# Patient Record
Sex: Female | Born: 1950
Health system: Southern US, Community
[De-identification: ages and names within clinical notes are randomized; demographics above are authoritative.]

## PROBLEM LIST (undated history)

## (undated) DIAGNOSIS — I10 Essential (primary) hypertension: Secondary | ICD-10-CM

## (undated) DIAGNOSIS — T7840XA Allergy, unspecified, initial encounter: Secondary | ICD-10-CM

## (undated) DIAGNOSIS — E785 Hyperlipidemia, unspecified: Secondary | ICD-10-CM

## (undated) HISTORY — PX: OVARIAN CYST SURGERY: SHX726

## (undated) HISTORY — DX: Essential (primary) hypertension: I10

## (undated) HISTORY — PX: TONSILLECTOMY: SUR1361

## (undated) HISTORY — PX: APPENDECTOMY: SHX54

## (undated) HISTORY — DX: Hyperlipidemia, unspecified: E78.5

## (undated) HISTORY — DX: Allergy, unspecified, initial encounter: T78.40XA

## (undated) HISTORY — PX: ABDOMINAL HYSTERECTOMY: SHX81

## (undated) HISTORY — PX: WISDOM TOOTH EXTRACTION: SHX21

## (undated) HISTORY — PX: TOTAL ABDOMINAL HYSTERECTOMY W/ BILATERAL SALPINGOOPHORECTOMY: SHX83

---

## 1999-03-24 ENCOUNTER — Encounter: Payer: Self-pay | Admitting: Internal Medicine

## 2004-03-07 ENCOUNTER — Other Ambulatory Visit: Admission: RE | Admit: 2004-03-07 | Discharge: 2004-03-07 | Payer: Self-pay | Admitting: Obstetrics and Gynecology

## 2004-03-18 ENCOUNTER — Encounter (INDEPENDENT_AMBULATORY_CARE_PROVIDER_SITE_OTHER): Payer: Self-pay | Admitting: *Deleted

## 2004-03-18 ENCOUNTER — Inpatient Hospital Stay (HOSPITAL_COMMUNITY): Admission: RE | Admit: 2004-03-18 | Discharge: 2004-03-20 | Payer: Self-pay | Admitting: Obstetrics and Gynecology

## 2004-07-09 ENCOUNTER — Ambulatory Visit: Payer: Self-pay | Admitting: Internal Medicine

## 2004-07-23 ENCOUNTER — Ambulatory Visit: Payer: Self-pay | Admitting: Gastroenterology

## 2004-07-28 ENCOUNTER — Ambulatory Visit (HOSPITAL_COMMUNITY): Admission: RE | Admit: 2004-07-28 | Discharge: 2004-07-28 | Payer: Self-pay | Admitting: Internal Medicine

## 2004-08-08 ENCOUNTER — Encounter: Payer: Self-pay | Admitting: Internal Medicine

## 2004-08-08 ENCOUNTER — Ambulatory Visit: Payer: Self-pay | Admitting: Gastroenterology

## 2004-08-28 ENCOUNTER — Ambulatory Visit: Payer: Self-pay | Admitting: Internal Medicine

## 2004-10-01 ENCOUNTER — Ambulatory Visit: Payer: Self-pay | Admitting: Internal Medicine

## 2004-10-08 ENCOUNTER — Ambulatory Visit: Payer: Self-pay | Admitting: Internal Medicine

## 2005-04-24 ENCOUNTER — Ambulatory Visit: Payer: Self-pay | Admitting: Internal Medicine

## 2005-09-08 ENCOUNTER — Ambulatory Visit (HOSPITAL_COMMUNITY): Admission: RE | Admit: 2005-09-08 | Discharge: 2005-09-08 | Payer: Self-pay | Admitting: Internal Medicine

## 2005-11-18 ENCOUNTER — Ambulatory Visit: Payer: Self-pay | Admitting: Internal Medicine

## 2005-11-25 ENCOUNTER — Ambulatory Visit: Payer: Self-pay | Admitting: Internal Medicine

## 2006-02-16 ENCOUNTER — Ambulatory Visit: Payer: Self-pay | Admitting: Internal Medicine

## 2006-02-16 ENCOUNTER — Encounter: Admission: RE | Admit: 2006-02-16 | Discharge: 2006-02-16 | Payer: Self-pay | Admitting: Internal Medicine

## 2006-09-29 ENCOUNTER — Ambulatory Visit: Payer: Self-pay | Admitting: Internal Medicine

## 2006-10-01 ENCOUNTER — Ambulatory Visit (HOSPITAL_COMMUNITY): Admission: RE | Admit: 2006-10-01 | Discharge: 2006-10-01 | Payer: Self-pay | Admitting: Internal Medicine

## 2006-12-31 ENCOUNTER — Encounter: Payer: Self-pay | Admitting: Internal Medicine

## 2007-01-19 ENCOUNTER — Ambulatory Visit: Payer: Self-pay | Admitting: Internal Medicine

## 2007-01-19 LAB — CONVERTED CEMR LAB
ALT: 19 units/L (ref 0–40)
AST: 21 units/L (ref 0–37)
Albumin: 4 g/dL (ref 3.5–5.2)
Alkaline Phosphatase: 61 units/L (ref 39–117)
BUN: 20 mg/dL (ref 6–23)
Basophils Absolute: 0 10*3/uL (ref 0.0–0.1)
Basophils Relative: 0.4 % (ref 0.0–1.0)
Bilirubin, Direct: 0.1 mg/dL (ref 0.0–0.3)
CO2: 31 meq/L (ref 19–32)
Calcium: 9.3 mg/dL (ref 8.4–10.5)
Chloride: 109 meq/L (ref 96–112)
Cholesterol: 244 mg/dL (ref 0–200)
Creatinine, Ser: 0.7 mg/dL (ref 0.4–1.2)
Direct LDL: 149 mg/dL
Eosinophils Absolute: 0.1 10*3/uL (ref 0.0–0.6)
Eosinophils Relative: 1.8 % (ref 0.0–5.0)
GFR calc Af Amer: 111 mL/min
GFR calc non Af Amer: 92 mL/min
Glucose, Bld: 96 mg/dL (ref 70–99)
HCT: 42.6 % (ref 36.0–46.0)
HDL: 67.5 mg/dL (ref >39.0)
Hemoglobin: 14.2 g/dL (ref 12.0–15.0)
Lymphocytes Relative: 28.7 % (ref 12.0–46.0)
MCHC: 33.4 g/dL (ref 30.0–36.0)
MCV: 88.3 fL (ref 78.0–100.0)
Monocytes Absolute: 0.4 10*3/uL (ref 0.2–0.7)
Monocytes Relative: 7.4 % (ref 3.0–11.0)
Neutro Abs: 3.3 10*3/uL (ref 1.4–7.7)
Neutrophils Relative %: 61.7 % (ref 43.0–77.0)
Platelets: 316 10*3/uL (ref 150–400)
Potassium: 4.3 meq/L (ref 3.5–5.1)
RBC: 4.82 M/uL (ref 3.87–5.11)
RDW: 12.7 % (ref 11.5–14.6)
Sodium: 143 meq/L (ref 135–145)
TSH: 2.13 microintl units/mL (ref 0.35–5.50)
Total Bilirubin: 0.7 mg/dL (ref 0.3–1.2)
Total CHOL/HDL Ratio: 3.6
Total Protein: 6.9 g/dL (ref 6.0–8.3)
Triglycerides: 61 mg/dL (ref 0–149)
VLDL: 12 mg/dL (ref 0–40)
WBC: 5.3 10*3/uL (ref 4.5–10.5)

## 2007-01-26 ENCOUNTER — Ambulatory Visit: Payer: Self-pay | Admitting: Internal Medicine

## 2007-09-08 LAB — CONVERTED CEMR LAB: Pap Smear: NORMAL

## 2007-11-07 ENCOUNTER — Ambulatory Visit (HOSPITAL_COMMUNITY): Admission: RE | Admit: 2007-11-07 | Discharge: 2007-11-07 | Payer: Self-pay | Admitting: *Deleted

## 2008-07-05 ENCOUNTER — Ambulatory Visit: Payer: Self-pay | Admitting: Internal Medicine

## 2008-07-05 LAB — CONVERTED CEMR LAB
ALT: 22 units/L (ref 0–35)
Albumin: 4.2 g/dL (ref 3.5–5.2)
Alkaline Phosphatase: 60 units/L (ref 39–117)
BUN: 19 mg/dL (ref 6–23)
Basophils Absolute: 0 10*3/uL (ref 0.0–0.1)
Basophils Relative: 0.7 % (ref 0.0–3.0)
Bilirubin Urine: NEGATIVE
Bilirubin, Direct: 0.1 mg/dL (ref 0.0–0.3)
CO2: 28 meq/L (ref 19–32)
Calcium: 9.7 mg/dL (ref 8.4–10.5)
Chloride: 103 meq/L (ref 96–112)
Cholesterol: 275 mg/dL (ref 0–200)
Creatinine, Ser: 0.7 mg/dL (ref 0.4–1.2)
Crystals: NEGATIVE
Direct LDL: 164.2 mg/dL
Eosinophils Absolute: 0.1 10*3/uL (ref 0.0–0.7)
Eosinophils Relative: 1.5 % (ref 0.0–5.0)
GFR calc non Af Amer: 91 mL/min
HCT: 39.6 % (ref 36.0–46.0)
HDL: 73.7 mg/dL (ref >39.0)
Hemoglobin, Urine: NEGATIVE
Hemoglobin: 13.8 g/dL (ref 12.0–15.0)
Ketones, ur: NEGATIVE mg/dL
Lymphocytes Relative: 31.2 % (ref 12.0–46.0)
MCHC: 34.9 g/dL (ref 30.0–36.0)
MCV: 87.4 fL (ref 78.0–100.0)
Mucus, UA: NEGATIVE
Neutro Abs: 3 10*3/uL (ref 1.4–7.7)
Neutrophils Relative %: 60.9 % (ref 43.0–77.0)
Nitrite: NEGATIVE
Potassium: 4.1 meq/L (ref 3.5–5.1)
RBC: 4.54 M/uL (ref 3.87–5.11)
RDW: 12.5 % (ref 11.5–14.6)
Sodium: 139 meq/L (ref 135–145)
Specific Gravity, Urine: 1.01 (ref 1.000–1.03)
TSH: 1.63 microintl units/mL (ref 0.35–5.50)
Total Bilirubin: 0.9 mg/dL (ref 0.3–1.2)
Total CHOL/HDL Ratio: 3.7
Total Protein, Urine: NEGATIVE mg/dL
Triglycerides: 46 mg/dL (ref 0–149)
Urine Glucose: NEGATIVE mg/dL
Urobilinogen, UA: 0.2 (ref 0.0–1.0)
VLDL: 9 mg/dL (ref 0–40)
WBC: 5 10*3/uL (ref 4.5–10.5)
pH: 5.5 (ref 5.0–8.0)

## 2008-07-12 ENCOUNTER — Ambulatory Visit: Payer: Self-pay | Admitting: Internal Medicine

## 2008-07-12 DIAGNOSIS — E785 Hyperlipidemia, unspecified: Secondary | ICD-10-CM | POA: Insufficient documentation

## 2008-10-17 ENCOUNTER — Ambulatory Visit: Payer: Self-pay | Admitting: Internal Medicine

## 2008-11-07 ENCOUNTER — Ambulatory Visit (HOSPITAL_COMMUNITY): Admission: RE | Admit: 2008-11-07 | Discharge: 2008-11-07 | Payer: Self-pay | Admitting: Internal Medicine

## 2008-11-16 ENCOUNTER — Telehealth: Payer: Self-pay | Admitting: Internal Medicine

## 2009-03-29 ENCOUNTER — Ambulatory Visit: Payer: Self-pay | Admitting: Family Medicine

## 2009-07-22 ENCOUNTER — Encounter (INDEPENDENT_AMBULATORY_CARE_PROVIDER_SITE_OTHER): Payer: Self-pay | Admitting: *Deleted

## 2009-08-19 ENCOUNTER — Ambulatory Visit: Payer: Self-pay | Admitting: Internal Medicine

## 2009-10-15 ENCOUNTER — Encounter (INDEPENDENT_AMBULATORY_CARE_PROVIDER_SITE_OTHER): Payer: Self-pay

## 2009-10-16 ENCOUNTER — Ambulatory Visit: Payer: Self-pay | Admitting: Gastroenterology

## 2009-10-30 ENCOUNTER — Ambulatory Visit: Payer: Self-pay | Admitting: Gastroenterology

## 2009-11-25 ENCOUNTER — Ambulatory Visit (HOSPITAL_COMMUNITY): Admission: RE | Admit: 2009-11-25 | Discharge: 2009-11-25 | Payer: Self-pay | Admitting: Internal Medicine

## 2009-11-25 LAB — HM MAMMOGRAPHY

## 2010-01-05 LAB — CONVERTED CEMR LAB: Pap Smear: NORMAL

## 2010-01-28 ENCOUNTER — Ambulatory Visit: Payer: Self-pay | Admitting: Internal Medicine

## 2010-01-28 LAB — CONVERTED CEMR LAB
ALT: 27 units/L (ref 0–35)
AST: 29 units/L (ref 0–37)
Alkaline Phosphatase: 55 units/L (ref 39–117)
BUN: 21 mg/dL (ref 6–23)
Basophils Relative: 1 % (ref 0.0–3.0)
Bilirubin, Direct: 0.1 mg/dL (ref 0.0–0.3)
Chloride: 110 meq/L (ref 96–112)
Direct LDL: 154.4 mg/dL
Eosinophils Absolute: 0.1 10*3/uL (ref 0.0–0.7)
Eosinophils Relative: 2.4 % (ref 0.0–5.0)
GFR calc non Af Amer: 112.46 mL/min (ref >60)
Glucose, Bld: 88 mg/dL (ref 70–99)
HCT: 37.7 % (ref 36.0–46.0)
Hemoglobin: 13.2 g/dL (ref 12.0–15.0)
Ketones, ur: NEGATIVE mg/dL
Leukocytes, UA: NEGATIVE
Lymphocytes Relative: 31.5 % (ref 12.0–46.0)
Lymphs Abs: 1.6 10*3/uL (ref 0.7–4.0)
MCHC: 35 g/dL (ref 30.0–36.0)
MCV: 88.9 fL (ref 78.0–100.0)
Monocytes Relative: 7.1 % (ref 3.0–12.0)
Neutro Abs: 2.9 10*3/uL (ref 1.4–7.7)
Platelets: 239 10*3/uL (ref 150.0–400.0)
Potassium: 4.5 meq/L (ref 3.5–5.1)
RBC: 4.24 M/uL (ref 3.87–5.11)
Sodium: 143 meq/L (ref 135–145)
Specific Gravity, Urine: 1.01 (ref 1.000–1.030)
TSH: 2.63 microintl units/mL (ref 0.35–5.50)
Total Bilirubin: 0.8 mg/dL (ref 0.3–1.2)
Total Protein, Urine: NEGATIVE mg/dL
Total Protein: 6.3 g/dL (ref 6.0–8.3)
Urine Glucose: NEGATIVE mg/dL
Urobilinogen, UA: 0.2 (ref 0.0–1.0)
VLDL: 8 mg/dL (ref 0.0–40.0)
WBC: 5.1 10*3/uL (ref 4.5–10.5)
pH: 5.5 (ref 5.0–8.0)

## 2010-02-04 ENCOUNTER — Ambulatory Visit: Payer: Self-pay | Admitting: Internal Medicine

## 2010-09-07 LAB — HM PAP SMEAR: HM Pap smear: NORMAL

## 2010-10-07 NOTE — Letter (Signed)
Summary: [facility] Instructions  [name]  [address] [place], [place] [address]   Phone: [phone]  Fax: [phone]       Toni Mcgrath    07-31-1951    MRN: [id number]        Procedure Day Dorna Bloom:  Wednesday 10/30/09     Arrival Time:  9:00am     Procedure Time:  10:00am     Location of Procedure:                    _ X_  [facility] (4th Floor)                        PREPARATION FOR COLONOSCOPY WITH MOVIPREP   Starting 5 days prior to your procedure   Friday 02/23  do not eat nuts, seeds, popcorn, corn, beans, peas,  salads, or any raw vegetables.  Do not take any fiber supplements (e.g. Metamucil, Citrucel, and Benefiber).  THE DAY BEFORE YOUR PROCEDURE         DATE:  02/22   DAY:  Tuesday  1.  Drink clear liquids the entire day-NO SOLID FOOD  2.  Do not drink anything colored red or purple.  Avoid juices with pulp.  No orange juice.  3.  Drink at least 64 oz. (8 glasses) of fluid/clear liquids during the day to prevent dehydration and help the prep work efficiently.  CLEAR LIQUIDS INCLUDE: Water Jello Ice Popsicles Tea (sugar ok, no milk/cream) Powdered fruit flavored drinks Coffee (sugar ok, no milk/cream) Gatorade Juice: apple, white grape, white cranberry  Lemonade Clear bullion, consomm, broth Carbonated beverages (any kind) Strained chicken noodle soup Hard Candy                             4.  In the morning, mix first dose of MoviPrep solution:    Empty 1 Pouch A and 1 Pouch B into the disposable container    Add lukewarm drinking water to the top line of the container. Mix to dissolve    Refrigerate (mixed solution should be used within 24 hrs)  5.  Begin drinking the prep at 5:00 p.m. The MoviPrep container is divided by 4 marks.   Every 15 minutes drink the solution down to the next mark (approximately 8 oz) until the full liter is complete.   6.  Follow completed prep with 16 oz of clear liquid of your  choice (Nothing red or purple).  Continue to drink clear liquids until bedtime.  7.  Before going to bed, mix second dose of MoviPrep solution:    Empty 1 Pouch A and 1 Pouch B into the disposable container    Add lukewarm drinking water to the top line of the container. Mix to dissolve    Refrigerate  THE DAY OF YOUR PROCEDURE      DATE:  02/23  DAY:  Wednesday  Beginning at  5:00 a.m. (5 hours before procedure):         1. Every 15 minutes, drink the solution down to the next mark (approx 8 oz) until the full liter is complete.  2. Follow completed prep with 16 oz. of clear liquid of your choice.    3. You may drink clear liquids until  8:00am  (2 HOURS BEFORE PROCEDURE).   MEDICATION INSTRUCTIONS  Unless otherwise instructed, you should take regular prescription medications with a  small sip of water   as early as possible the morning of your procedure.          OTHER INSTRUCTIONS  You will need a responsible adult at least 62 years of age to accompany you and drive you home.   This person must remain in the waiting room during your procedure.  Wear loose fitting clothing that is easily removed.  Leave jewelry and other valuables at home.  However, you may wish to bring a book to read or  an iPod/MP3 player to listen to music as you wait for your procedure to start.  Remove all body piercing jewelry and leave at home.  Total time from sign-in until discharge is approximately 2-3 hours.  You should go home directly after your procedure and rest.  You can resume normal activities the  day after your procedure.  The day of your procedure you should not:   Drive   Make legal decisions   Operate machinery   Drink alcohol   Return to work  You will receive specific instructions about eating, activities and medications before you leave.    The above instructions have been reviewed and explained to me by  Ulis Rias RN  October 16, 2009 8:12 AM     I  fully understand and can verbalize these instructions _____________________________ Date _________

## 2010-10-07 NOTE — Miscellaneous (Signed)
Summary: Lec previsit  Clinical Lists Changes  Medications: Added new medication of MOVIPREP 100 GM  SOLR (PEG-KCL-NACL-NASULF-NA ASC-C) As per prep instructions. - Signed Rx of MOVIPREP 100 GM  SOLR (PEG-KCL-NACL-NASULF-NA ASC-C) As per prep instructions.;  #1 x 0;  Signed;  Entered by: Ulis Rias RN;  Authorized by: Louis Meckel MD;  Method used: Electronically to [facility] [phone]*, [address] [place], [place], [place]  [address], Ph: [phone] or [phone], Fax: [phone] Observations: Added new observation of ALLERGY REV: Done (10/16/2009 7:54)    Prescriptions: MOVIPREP 100 GM  SOLR (PEG-KCL-NACL-NASULF-NA ASC-C) As per prep instructions.  #1 x 0   Entered by:   Ulis Rias RN   Authorized by:   Louis Meckel MD   Signed by:   Ulis Rias RN on 10/16/2009   Method used:   Electronically to        [facility] [phone]* (retail)       [address] [place], [place]  [address]       Ph: [phone] or [phone]       Fax: [phone]   RxID:   [id number]

## 2010-10-07 NOTE — Assessment & Plan Note (Signed)
Summary: CPX // RS   Vital Signs:  Patient profile:   60 year old female Height:      63.5 inches Weight:      152 pounds BMI:     26.60 Temp:     98 degrees F oral Pulse rate:   72 / minute Resp:     12 per minute BP sitting:   142 / 78  Vitals Entered By: Lynann Beaver CMA (Feb 04, 2010 10:06 AM)  Serial Vital Signs/Assessments:  Time      Position  BP       Pulse  Resp  Temp     By                     130/84                         Birdie Sons MD  CC: cpx Is Patient Diabetic? No Pain Assessment Patient in pain? no        CC:  cpx.  History of Present Illness: cpx   Current Medications (verified): 1)  Triamcinolone Acetonide 0.5 % Crea (Triamcinolone Acetonide) .... Apply 1 A Small Amount To Affected Area Twice A Day 2)  Multivitamins   Caps (Multiple Vitamin) .... Once Daily 3)  Caltrate 600+d 600-400 Mg-Unit  Tabs (Calcium Carbonate-Vitamin D) .... Calcium 1500mg  and Vit D 1000 International Units Once Daily 4)  Aspirin 81 Mg  Tbec (Aspirin) .... Once Daily 5)  Fish Oil 500 Mg  Caps (Omega-3 Fatty Acids) .... 600 Mg Two Times A Day? 6)  Flax Seed Oil 1000 Mg  Caps (Flaxseed (Linseed)) .... Two Times A Day 7)  Vitamin C Cr 500 Mg Cr-Caps (Ascorbic Acid) .... Once Daily 8)  Vitamin D 400 Unit Tabs (Cholecalciferol) .... 750 Mg Daily 9)  Super B Complex  Tabs (B Complex-C) .... One By Mouth Daily  Allergies (verified): No Known Drug Allergies  Past History:  Past Medical History: Last updated: 04/20/2007 Hyperlipidemia Hypertension  Past Surgical History: Last updated: 2008/08/09 ovarian cyst  1973--incidental appendectomy Hysterectomy, TAH, BSO  2005--  Family History: Last updated: 08/09/2008 mother deceased--stroke 52 father - heart disease deceased 59  Social History: Last updated: 08/09/2008 Occupation: attorney Married Never Smoked Alcohol use-yes Regular exercise-no  Risk Factors: Exercise: no (2008-08-09)  Risk Factors: Smoking  Status: never (08/09/2008)  Physical Exam  General:  Well-developed,well-nourished,in no acute distress; alert,appropriate and cooperative throughout examination Head:  normocephalic and atraumatic.   Eyes:  pupils equal and pupils round.   Ears:  R ear normal and L ear normal.   Neck:  No deformities, masses, or tenderness noted. Chest Wall:  No deformities, masses, or tenderness noted. Lungs:  Normal respiratory effort, chest expands symmetrically. Lungs are clear to auscultation, no crackles or wheezes. Heart:  normal rate, regular rhythm, and no gallop.   Abdomen:  Bowel sounds positive,abdomen soft and non-tender without masses, organomegaly or hernias noted. Msk:  No deformity or scoliosis noted of thoracic or lumbar spine.   Extremities:  No clubbing, cyanosis, edema, or deformity noted  Neurologic:  cranial nerves II-XII intact and gait normal.   Skin:  turgor normal and color normal.   patch of scaley skin behind left ear----3 cm in diameter   Impression & Recommendations:  Problem # 1:  PREVENTIVE HEALTH CARE (ICD-V70.0) health maint UTD reviewed records of colonoscopy/mammo/pap smear she will maintain activelfestyle and excellent health habits.   Problem #  2:  HYPERLIPIDEMIA (ICD-272.4) no treatment note HDL  Complete Medication List: 1)  Triamcinolone Acetonide 0.5 % Crea (Triamcinolone acetonide) .... Apply 1 a small amount to affected area twice a day 2)  Multivitamins Caps (Multiple vitamin) .... Once daily 3)  Caltrate 600+d 600-400 Mg-unit Tabs (Calcium carbonate-vitamin d) .... Calcium 1500mg  and vit d 1000 international units once daily 4)  Aspirin 81 Mg Tbec (Aspirin) .... Once daily 5)  Fish Oil 500 Mg Caps (Omega-3 fatty acids) .... 600 mg two times a day? 6)  Flax Seed Oil 1000 Mg Caps (Flaxseed (linseed)) .... Two times a day 7)  Vitamin C Cr 500 Mg Cr-caps (Ascorbic acid) .... Once daily 8)  Vitamin D 400 Unit Tabs (Cholecalciferol) .... 750 mg  daily 9)  Super B Complex Tabs (B complex-c) .... One by mouth daily Prescriptions: TRIAMCINOLONE ACETONIDE 0.5 % CREA (TRIAMCINOLONE ACETONIDE) Apply 1 a small amount to affected area twice a day  #30 grams x 1   Entered and Authorized by:   Birdie Sons MD   Signed by:   Birdie Sons MD on 02/04/2010   Method used:   Electronically to        [facility] [phone]* (retail)       [address] [place], [place]  [address]       Ph: [phone] or [phone]       Fax: [phone]   RxID:   [id number]      Preventive Care Screening  Mammogram:    Date:  11/05/2009    Next Due:  12/2011    Results:  normal   Pap Smear:    Date:  01/05/2010    Next Due:  01/2013    Results:  normal

## 2010-10-07 NOTE — Procedures (Signed)
Summary: Colonoscopy  Patient: [name] Note: All result statuses are Final unless otherwise noted.  Tests: (1) Colonoscopy (COL)   COL Colonoscopy           DONE     [facility]     [address]     [place], [place]  [address]           COLONOSCOPY PROCEDURE REPORT           PATIENT:  Toni Mcgrath, Toni Mcgrath  MR#:  [id number]     BIRTHDATE:  08/30/1951, 58 yrs. old  GENDER:  female           ENDOSCOPIST:  Barbette Hair. Arlyce Dice, MD     Referred by:           PROCEDURE DATE:  10/30/2009     PROCEDURE:  Colonoscopy, Diagnostic     ASA CLASS:  Class I     INDICATIONS:  history of pre-cancerous (adenomatous) colon polyps                 MEDICATIONS:   Fentanyl 50 mcg IV, Versed 7 mg IV           DESCRIPTION OF PROCEDURE:   After the risks benefits and     alternatives of the procedure were thoroughly explained, informed     consent was obtained.  Digital rectal exam was performed and     revealed no abnormalities.   The LB [id number]-[id number] endoscope     was introduced through the anus and advanced to the cecum, which     was identified by both the appendix and ileocecal valve, without     limitations.  The quality of the prep was excellent, using     MoviPrep.  The instrument was then slowly withdrawn as the colon     was fully examined.     <<PROCEDUREIMAGES>>           FINDINGS:  A normal appearing cecum, ileocecal valve, and     appendiceal orifice were identified. The ascending, hepatic     flexure, transverse, splenic flexure, descending, sigmoid colon,     and rectum appeared unremarkable (see image1, image2, image3,     image5, image7, image10, image12, image14, and image16).     Retroflexed views in the rectum revealed no abnormalities.    The     scope was then withdrawn from the patient and the procedure     completed.           COMPLICATIONS:  None           ENDOSCOPIC IMPRESSION:     1) Normal colon     RECOMMENDATIONS:     1) colonoscopy in 10 years        REPEAT EXAM:  In 10 year(s) for Colonoscopy.           ______________________________     Barbette Hair. Arlyce Dice, MD           CC:  Lindley Magnus, MD           n.     Rosalie DoctorBarbette Hair. Aalina Brege at 10/30/2009 11:12 AM           [place], Synetta Fail, [phone]  Note: An exclamation mark (!) indicates a result that was not dispersed into the flowsheet. Document Creation Date: 10/30/2009 11:12 AM _______________________________________________________________________  (1) Order result status: Final Collection or observation date-time: 10/30/2009 11:02 Requested date-time:  Receipt date-time:  Reported  date-time:  Referring Physician:   Ordering Physician: Melvia Heaps [phone]) Specimen Source:  Source: Launa Grill Order Number: [id number] Lab site:   Appended Document: Colonoscopy    Clinical Lists Changes  Observations: Added new observation of COLONNXTDUE: 10/2019 (10/30/2009 11:20)      Appended Document: Colonoscopy     Procedures Next Due Date:    Colonoscopy: 10/2019

## 2010-12-10 ENCOUNTER — Other Ambulatory Visit: Payer: Self-pay | Admitting: Internal Medicine

## 2010-12-10 DIAGNOSIS — Z1231 Encounter for screening mammogram for malignant neoplasm of breast: Secondary | ICD-10-CM

## 2010-12-11 ENCOUNTER — Ambulatory Visit (HOSPITAL_COMMUNITY)
Admission: RE | Admit: 2010-12-11 | Discharge: 2010-12-11 | Disposition: A | Discharge status: Home / Self Care | Payer: BC Managed Care – PPO | Source: Ambulatory Visit | Attending: Internal Medicine | Admitting: Internal Medicine

## 2010-12-11 DIAGNOSIS — Z1231 Encounter for screening mammogram for malignant neoplasm of breast: Secondary | ICD-10-CM

## 2010-12-17 ENCOUNTER — Encounter: Payer: Self-pay | Admitting: Internal Medicine

## 2010-12-18 ENCOUNTER — Ambulatory Visit (INDEPENDENT_AMBULATORY_CARE_PROVIDER_SITE_OTHER): Payer: BC Managed Care – PPO | Admitting: Internal Medicine

## 2010-12-18 ENCOUNTER — Encounter: Payer: Self-pay | Admitting: Internal Medicine

## 2010-12-18 VITALS — BP 120/82 | HR 83 | Temp 98.3°F | Ht 64.0 in | Wt 158.0 lb

## 2010-12-18 DIAGNOSIS — J40 Bronchitis, not specified as acute or chronic: Secondary | ICD-10-CM

## 2010-12-18 MED ORDER — HYDROCOD POLST-CHLORPHEN POLST 10-8 MG/5ML PO LQCR: 5.0000 mL | Freq: Two times a day (BID) | ORAL | Status: DC | PRN

## 2010-12-18 MED ORDER — DOXYCYCLINE HYCLATE 100 MG PO TABS: 100.0000 mg | ORAL_TABLET | Freq: Two times a day (BID) | ORAL | Status: AC

## 2010-12-18 NOTE — Progress Notes (Signed)
  Subjective:    Patient ID: Toni Mcgrath, female    DOB: 1950-12-16, 60 y.o.   MRN: [id number]  HPI patient presents to clinic for evaluation of cough. Notes two week history of cough sore throat and nasal congestion without fever chills shortness of breath or wheezing. Cough is nonproductive. No known sick exposure.Taking over-the-counter medication without improvement. No other exacerbating or alleviating factors. No other complaints.  Review of past medical history, medications and allergies.  Review of Systems  Constitutional: Negative for fever, chills and fatigue.  HENT: Positive for congestion and rhinorrhea. Negative for facial swelling and ear discharge.   Eyes: Negative for discharge and redness.  Respiratory: Positive for cough. Negative for shortness of breath and wheezing.        Objective:   Physical Exam  Vitals reviewed. Constitutional: She appears well-developed and well-nourished. No distress.  HENT:  Head: Normocephalic and atraumatic.  Right Ear: Tympanic membrane, external ear and ear canal normal.  Left Ear: Tympanic membrane, external ear and ear canal normal.  Nose: Nose normal.  Mouth/Throat: Oropharynx is clear and moist. No oropharyngeal exudate.  Eyes: Conjunctivae are normal. No scleral icterus.  Neck: Neck supple.  Cardiovascular: Normal rate, regular rhythm and normal heart sounds.  Exam reveals no gallop and no friction rub.   No murmur heard. Pulmonary/Chest: Effort normal and breath sounds normal. No respiratory distress. She has no wheezes. She has no rales.  Lymphadenopathy:    She has no cervical adenopathy.  Neurological: She is alert.  Skin: Skin is warm and dry. No rash noted. She is not diaphoretic. No erythema.  Psychiatric: She has a normal mood and affect.          Assessment & Plan:

## 2010-12-18 NOTE — Assessment & Plan Note (Signed)
Begin ten-day course of doxycycline. Attempt Tussionex when necessary cough. Cautioned regarding possible sedating effect. Followup if no improvement or worsening

## 2011-01-23 ENCOUNTER — Other Ambulatory Visit: Payer: Self-pay | Admitting: Internal Medicine

## 2011-01-23 ENCOUNTER — Other Ambulatory Visit (INDEPENDENT_AMBULATORY_CARE_PROVIDER_SITE_OTHER): Payer: BC Managed Care – PPO

## 2011-01-23 ENCOUNTER — Other Ambulatory Visit (INDEPENDENT_AMBULATORY_CARE_PROVIDER_SITE_OTHER): Payer: BC Managed Care – PPO | Admitting: Internal Medicine

## 2011-01-23 DIAGNOSIS — Z0000 Encounter for general adult medical examination without abnormal findings: Secondary | ICD-10-CM

## 2011-01-23 DIAGNOSIS — Z13220 Encounter for screening for lipoid disorders: Secondary | ICD-10-CM

## 2011-01-23 LAB — HEPATIC FUNCTION PANEL
ALT: 20 U/L (ref 0–35)
AST: 23 U/L (ref 0–37)
Albumin: 3.7 g/dL (ref 3.5–5.2)
Alkaline Phosphatase: 52 U/L (ref 39–117)
Total Protein: 6.3 g/dL (ref 6.0–8.3)

## 2011-01-23 LAB — URINALYSIS, ROUTINE W REFLEX MICROSCOPIC
Hgb urine dipstick: NEGATIVE
Nitrite: NEGATIVE
Total Protein, Urine: NEGATIVE
Urine Glucose: NEGATIVE
pH: 6 (ref 5.0–8.0)

## 2011-01-23 LAB — CBC WITH DIFFERENTIAL/PLATELET
Basophils Absolute: 0.1 10*3/uL (ref 0.0–0.1)
Eosinophils Absolute: 0.4 10*3/uL (ref 0.0–0.7)
Hemoglobin: 13.9 g/dL (ref 12.0–15.0)
Lymphocytes Relative: 33.6 % (ref 12.0–46.0)
MCHC: 34.8 g/dL (ref 30.0–36.0)
Neutro Abs: 2.5 10*3/uL (ref 1.4–7.7)
Platelets: 265 10*3/uL (ref 150.0–400.0)
RDW: 13.4 % (ref 11.5–14.6)

## 2011-01-23 LAB — LDL CHOLESTEROL, DIRECT: Direct LDL: 165.3 mg/dL

## 2011-01-23 LAB — BASIC METABOLIC PANEL
Calcium: 9.4 mg/dL (ref 8.4–10.5)
Chloride: 105 mEq/L (ref 96–112)
Creatinine, Ser: 0.8 mg/dL (ref 0.4–1.2)
Sodium: 139 mEq/L (ref 135–145)

## 2011-01-23 LAB — LIPID PANEL
Cholesterol: 242 mg/dL — ABNORMAL HIGH (ref 0–200)
Total CHOL/HDL Ratio: 3
Triglycerides: 31 mg/dL (ref 0.0–149.0)

## 2011-01-23 NOTE — H&P (Signed)
NAME:  BRINDLEY, MADARANG                         ACCOUNT NO.:  [id number]   MEDICAL RECORD NO.:  [id number]                   PATIENT TYPE:  INP   LOCATION:  NA                                   [facility]ACILITY:  [facility]   PHYSICIAN:  Carrington Clamp, M.D.              DATE OF BIRTH:  01-Feb-1951   DATE OF ADMISSION:  DATE OF DISCHARGE:                                HISTORY & PHYSICAL   CHIEF COMPLAINT:  This is a 60 year old, G0 referred to me by Valetta Mole.  Swords, M.D. for evaluation of pelvic mass.   HISTORY OF PRESENT ILLNESS:  Ms. Griffy had presented for her routine  annual exam and was found to have an enlarged abdomen.  The patient stated  that she had had increasing waist size but no decrease in appetite, no early  satiety and no pain or any other abnormalities with her bowel movements over  the last year to two years.  The patient had stopped her periods several  years ago, has not been on hormone replacement therapy, has not had any  vaginal bleeding, has not had any postcoital vaginal bleeding. The patient  complained of some vaginal dryness and pain from dryness but no other  abdominal pain or dyspareunia. The patient may have had some hot flashes in  the past but not recently. The patient complains of no weight loss.  The  patient complains of no increase in voiding, loss of urine with cough,  constipation, diarrhea or blood in stools.   The patient had undergone a CT examination which showed a large complex mass  23 x 12 x 30 cm which was majority fluid.  The patient was then referred to  me for evaluation.   PAST MEDICAL HISTORY:  The patient has a history of elevated cholesterol but  no history of angina, chest pain, shortness of breath, diabetes, high blood  pressure or thyroid disease.   PAST SURGICAL HISTORY:  The patient had an ovarian cystectomy, possible  oophorectomy and in 1993 with an appendectomy.  This was approximately a  basketball size cyst probably on the  right hand side and the procedure was  done through an exploratory laparotomy.   PAST GYN HISTORY:  The patient has no history of abnormality Pap smears,  sexually transmitted diseases or pelvic infections.   ALLERGIES:  No known drug allergies.   MEDICATIONS:  None.   TOBACCO:  None.   PHYSICAL EXAMINATION:  VITAL SIGNS:  The patient's blood pressure was  120/80.  GENERAL:  Well appearing and anicteric.  NECK:  Without thyromegaly or lymphadenopathy.  LUNGS:  Clear to auscultation bilaterally.  HEART:  Regular rate and rhythm.  ABDOMEN:  Showed a large immobile mass that made the abdomen look  approximately 28-[redacted] weeks pregnant. This was not tympanic. There was no  rebound, guarding or tenderness at all over the abdomen.  BREASTS:  Symmetric without masses or discharge.  PELVIC:  Revealed normal external genitalia, normal vault and vagina and  cervix. The cervix and uterus felt to be a normal size on rectovaginal exam  and cervix was mobile without thickening of the parametria. The adnexa were  not able to be palpated. The 30 cm mass seemed to be large and fairly  immobile.  Rectovaginal exam revealed negative guaiac.  A Pap smear was  performed at this time.   CT showed a large complex abdominopelvic mass extending from level 1 to the  bladder dome measuring 23 x 12 x 30 cm. The majority of the lesion was fluid  density; however, multiple thin septations were present. There was mild wall  thickening present at the left lateral inferior region. There was no  intraabdominal lymphadenopathy seen, no free fluid. The mass could not be  determined to be definitely from ovarian origin. Lungs and the abdominal  anatomy appeared to be normal.  Ultrasound performed confirmed the size of  the mass and also saw within the mass the complex cyst.  A thick walled  irregularly circumscribed anechoic focus about 6 x 4 x 6.  The endometrium  was irregularly thickened, there was no free fluid  seen. Neither ovary was  able to be identified but the mass appeared to be more to the left.  The  uterus was 8.6 x 4 x 5 and the endometrial thickness was actually 2 cm.   LABORATORY DATA:  CA 125 was performed at 32  with range 0 to 30, CEA was  2.5 range 0-5. HCG was 5.1, range less than 5.  CA 19-9 84, range less than  35.  H&H was 14.3 and 41.3, glucose 92, BUN and creatinine 21 and 0.8.   ASSESSMENT:  This is a 60 year old postmenopausal female with a large 3 cm  abdominopelvic mass suspected to be of ovarian origin. The patient will  undergo an exploratory laparotomy with total abdominal hysterectomy and  possible bilateral salpingo-oophorectomy depending on whether or not she  still has retained her right ovary. The pelvic mass will be removed and sent  to pathology for frozen section.  If the frozen section comes back positive  for cancer, Dr. Stanford Breed is on standby for pelvic lymph node  dissection and a staging procedure will be performed as well. The patient  has received a mild bowel prep of magnesium citrate. She will receive  preoperative antibiotics and __________ during surgery.  All risks,  benefits, and alternatives were discussed with the patient, the patient  understands and wishes to proceed.                                               Carrington Clamp, M.D.    MH/MEDQ  D:  03/18/2004  T:  03/18/2004  Job:  [id number]

## 2011-01-23 NOTE — Op Note (Signed)
NAME:  Toni Mcgrath, Toni Mcgrath                         ACCOUNT NO.:  [id number]   MEDICAL RECORD NO.:  [id number]                   PATIENT TYPE:  INP   LOCATION:  NA                                   FACILITY:  [facility]   PHYSICIAN:  Carrington Clamp, M.D.              DATE OF BIRTH:  [birth year]   DATE OF PROCEDURE:  03/18/2004  DATE OF DISCHARGE:                                 OPERATIVE REPORT   PREOPERATIVE DIAGNOSES:  _______ abdominopelvic mass.   POSTOPERATIVE DIAGNOSES:  Left mucinous cystadenoma of the ovary.   PROCEDURE:  Exploratory laparotomy with total abdominal hysterectomy, left  salpingo-oophorectomy.   ATTENDING PHYSICIAN:  Carrington Clamp, M.D.   ASSISTANT:  Randye Lobo, M.D.   ANESTHESIA:  General endotracheal anesthesia.   ESTIMATED BLOOD LOSS:  200 mL.   IV FLUIDS:  2500 mL.   URINE OUTPUT:  100 mL.   COMPLICATIONS:  None.   FINDINGS:  A 30 x 30 cm ovarian mass removed intact. There was no ovary  evident on the right, there were normal tubes, uterus, cervix, liver edge  seen.  There was no appendix seen as the patient had had a prior  appendectomy.  Palpation of kidneys and diaphragm was normal.   MEDICATIONS:  None.   PATHOLOGY:  Frozen section of the entire left ovarian mass revealed a benign  mucinous cystadenoma. Permanent section was left salpinx and ovarian mass,  uterus and cervix.   COUNTS:  Correct x3.   TECHNIQUE:  After adequate general anesthesia was achieved, the patient was  prepped and draped in the usual sterile fashion in dorsal lithotomy  position. A vertical skin incision was made with the scalpel through the  skin and carried down to the fascia with the Bovie cautery. The fascia was  incised in the midline with the scalpel in a superior and inferior manner  with the Mayo scissors. The fascia was grasped with the Kocher's and the  rectus muscles reflected from the fascia on the right hand side. The midline  was identified and the  bowel free portion of peritoneum tinted up and  entered into with the Metzenbaum scissors. The rectus muscles were split in  the midline and the fascia was incised a superior and inferior manner with  the Metzenbaum scissors with good visualization of the bowel and the  bladder.  Washings were taken at this point and the ovarian mass palpated  and found to be mobile but bigger than expected. The mass was approximately  30 cm across the top of the width and so the abdominal incision was extended  in the same manner as above to the falciform ligament.  The mass was then  carefully slipped out of the abdominal incision and identified as being the  left ovary.  The infundibulopelvic ligament was identified, the ureter was  identified as being close to the infundibulopelvic ligament but below the  field of  discharge. Keeping in mind the ureter, a Heaney was used to clamp  over the infundibulopelvic ligament with the ureter well below the field of  dissection. This was secured with two sutures of #0 Vicryl. The tube and  mesosalpinx were then clamped with the Heaney and again secured with a  stitch of #0 Vicryl at the level of the cornua of the uterus. The uterine  ovarian ligament was then clamped with a Heaney and incised with the Mayo  scissors.   The mass was able to be amputated and handed to waiting pathology completely  intact.   Attention was then turned to the round ligament which was secured with the  suture transfixion stitch of #0 Vicryl. The round ligament was divided with  the Bovie cautery. The infundibulopelvic ligament was once again clamped  with a Heaney. The ureter was again seen and added the field of dissection.  The avascular portion of the mesosalpinx was entered into with the Bovie  cautery and a free hand tie was placed around the infundibulopelvic ligament  followed by a stitch of #0 Vicryl.  Again the ureter was out of the field of  dissection. The peritoneum of the  round ligament was then incised with the  Bovie cautery and then anteriorly through the vesicouterine fascia to start  creating the bladder flap.   The round ligament on the right hand side was secured with a suture  transfixion stitch of #0 Vicryl. The pedicle was divided with the Bovie  cautery and the rest of the bladder flap created with blunt and sharp  dissection of Metzenbaum scissors. The uterine arteries on the right hand  side were skeletonized keeping the ureter outside of the field of  dissection.   At the level of internal os, a pair of Heaney's were used to clamp the  uterine arteries. Each pedicle was incised with the Mayo scissors and  secured with a stitch of #0 Vicryl. The ureter fell away bilaterally and  continued dissection of the cardinal ligament was then performed staying  close to the cervix with alternating successive bites of the straight Heaney  clamp. Each pedicle was divided with a knife and then secured with a stitch  of #0 Vicryl.  At the level of the reflection of the vagina onto the cervix  and also the uterosacral's, a pair of Heaney's were placed bilaterally. Mayo  scissors were used to incise the pedicles, each angle of the cuff was  secured with a Heaney stitch of #0 Vicryl. The cervix was then amputated and  the specimen handed off to pathology.   During the dissection of the cardinal ligament, the uterine cavity had been  entered into sharply and a quantity of old blood spilled out. This was  washed off the field and the uterus and cervix handed to pathology.   The remaining cuff was secured with a figure-of-eight stitch of #0 Vicryl.  The pelvis was irrigated with water and found to be hemostatic. All  instruments were withdrawn from the abdomen as the abdomen had been packed  away with three wet laps and the O'Conner-O'Sullivan retractor used. Everything was removed and the peritoneum was then closed with running  stitch of 2-0 Vicryl. This  incorporated from inferior to superior the rectus  muscles as well.  The fascia was then closed with running stitch of #0 PDS.  The subcutaneous tissue was rendered hemostatic with Bovie cautery and  irrigation. Interrupted stitches of #0 plain gut were used  to reapproximate  the anatomy of the belly button and close the dead space. The skin was  closed with staples. The patient tolerated the procedure well and was  returned to the recovery room in stable condition.                                               Carrington Clamp, M.D.    MH/MEDQ  D:  03/18/2004  T:  03/18/2004  Job:  [id number]

## 2011-01-23 NOTE — Discharge Summary (Signed)
NAME:  Toni Mcgrath, Toni Mcgrath                         ACCOUNT NO.:  [id number]   MEDICAL RECORD NO.:  [id number]                   PATIENT TYPE:  INP   LOCATION:  0445                                 FACILITY:  [facility]   PHYSICIAN:  Carrington Clamp, M.D.              DATE OF BIRTH:  April 06, 1951   DATE OF ADMISSION:  03/18/2004  DATE OF DISCHARGE:  03/20/2004                                 DISCHARGE SUMMARY   ADMISSION DIAGNOSIS:  An approximately 30 cm abdominal pelvic mass.   DISCHARGE DIAGNOSIS:  Left mucinous cystadenoma of the ovary.   PERTINENT PROCEDURES PERFORMED:  Total abdominal hysterectomy with left  salpingo-oophorectomy and exploratory laparotomy.   PERTINENT TEST RESULTS:  Preoperative H&H of 13.1 and 39; postoperative H&H  of 10.0 and 29.7.  Pathology: (1) A 30-cm mucinous cystadenoma approximately  9.5 pounds; (2) uterus and cervix with high-grade intraepithelial lesion  with endocervical gland involvement, no invasive disease. Endometrium and  myometrium with multiple fibroids and atrophic endometrium.   HISTORY AND PHYSICAL:  Please refer to the history and physical.  Briefly,  this is a 60 year old, G0, referred to me by Dr. Birdie Sons for evaluation  of pelvic mass. The patient has undergone an extensive workup and was found  to have a complex cystic mass arising from what appeared to the left adnexa.  The mass was approximately 23 x 12 x 30 cm on ultrasound. CA-125, CEA, and  CA-199 were all within normal limits.   HOSPITAL COURSE:  The patient underwent exploratory laparotomy with left  salpingo-oophorectomy on March 18, 2004.  After the pathologist had called  and indicated that the mass appeared to be benign in nature, total abdominal  hysterectomy had been undertaken and the patient had not undergone staging.  The patient did not have a right tube and ovary from previous procedure. The  patient was returned to the recovery room in stable condition and her  postoperative course was unremarkable. By postoperative day #2, she was  eating, ambulating, and voiding without complications. She had had the  catheter replaced on postoperative day #1 and by postoperative day #2 was  voiding without any problems. She was discharged to home with the following  medications Percocet 5 mg on p.o. q.4-6h. p.r.n. pain. Restrictions were  pelvic rest times six weeks, no heavy lifting or straining times six weeks,  no driving for four weeks. The patient is to return to my office in one week  for the staples to be removed. Diet is high fiber and high water.                                               Carrington Clamp, M.D.    MH/MEDQ  D:  04/30/2004  T:  05/01/2004  Job:  [id number]

## 2011-02-03 ENCOUNTER — Encounter: Payer: Self-pay | Admitting: Internal Medicine

## 2011-02-03 ENCOUNTER — Ambulatory Visit (INDEPENDENT_AMBULATORY_CARE_PROVIDER_SITE_OTHER): Payer: BC Managed Care – PPO | Admitting: Internal Medicine

## 2011-02-03 VITALS — BP 120/80 | HR 76 | Temp 97.8°F | Ht 63.0 in | Wt 158.0 lb

## 2011-02-03 DIAGNOSIS — Z0000 Encounter for general adult medical examination without abnormal findings: Secondary | ICD-10-CM

## 2011-02-03 DIAGNOSIS — Z2911 Encounter for prophylactic immunotherapy for respiratory syncytial virus (RSV): Secondary | ICD-10-CM

## 2011-02-03 MED ORDER — TRIAMCINOLONE ACETONIDE 0.5 % EX CREA: 1.0000 "application " | TOPICAL_CREAM | Freq: Two times a day (BID) | CUTANEOUS | Status: DC | PRN

## 2011-02-03 NOTE — Progress Notes (Signed)
  Subjective:    Patient ID: Toni Mcgrath, female    DOB: 1951-06-16, 60 y.o.   MRN: [id number]  HPI  Well visit  Past Medical History  Diagnosis Date  . Hypertension   . Hyperlipidemia    Past Surgical History  Procedure Date  . Total abdominal hysterectomy w/ bilateral salpingoophorectomy     reports that she has never smoked. She does not have any smokeless tobacco history on file. She reports that she drinks about .6 ounces of alcohol per week. She reports that she does not use illicit drugs. family history includes Heart disease in her father and Stroke in her mother. No Known Allergies   Review of Systems  patient denies chest pain, shortness of breath, orthopnea. Denies lower extremity edema, abdominal pain, change in appetite, change in bowel movements. Patient denies rashes, musculoskeletal complaints. No other specific complaints in a complete review of systems.      Objective:   Physical Exam  Well-developed well-nourished female in no acute distress. HEENT exam atraumatic, normocephalic, extraocular muscles are intact. Neck is supple. No jugular venous distention no thyromegaly. Chest clear to auscultation without increased work of breathing. Cardiac exam S1 and S2 are regular. Abdominal exam active bowel sounds, soft, nontender. Extremities no edema. Neurologic exam she is alert without any motor sensory deficits. Gait is normal.      Assessment & Plan:  Well visit Health maint UTD

## 2011-07-01 ENCOUNTER — Other Ambulatory Visit: Payer: Self-pay | Admitting: Obstetrics and Gynecology

## 2011-08-17 ENCOUNTER — Ambulatory Visit (INDEPENDENT_AMBULATORY_CARE_PROVIDER_SITE_OTHER): Payer: BC Managed Care – PPO | Admitting: Internal Medicine

## 2011-08-17 ENCOUNTER — Encounter: Payer: Self-pay | Admitting: Internal Medicine

## 2011-08-17 DIAGNOSIS — J309 Allergic rhinitis, unspecified: Secondary | ICD-10-CM

## 2011-08-17 MED ORDER — PREDNISONE 20 MG PO TABS: 20.0000 mg | ORAL_TABLET | Freq: Every day | ORAL | Status: AC

## 2011-08-17 NOTE — Progress Notes (Signed)
Patient ID: Toni Mcgrath, female   DOB: Jun 19, 1951, 60 y.o.   MRN: [id number] Allergic sxs:  Mild sore throat, eyes itching, sneezing. Duration one week No fever or chills, no ill contacts  Past Medical History  Diagnosis Date  . Hypertension   . Hyperlipidemia     History   Social History  . Marital Status: Married    Spouse Name: N/A    Number of Children: N/A  . Years of Education: N/A   Occupational History  . Not on file.   Social History Main Topics  . Smoking status: Never Smoker   . Smokeless tobacco: Not on file  . Alcohol Use: 0.6 oz/week    1 Glasses of wine per week  . Drug Use: No  . Sexually Active:    Other Topics Concern  . Not on file   Social History Narrative  . No narrative on file    Past Surgical History  Procedure Date  . Total abdominal hysterectomy w/ bilateral salpingoophorectomy     Family History  Problem Relation Age of Onset  . Stroke Mother   . Heart disease Father     No Known Allergies  Current Outpatient Prescriptions on File Prior to Visit  Medication Sig Dispense Refill  . ascorbic acid (VITAMIN C) 500 MG tablet Take 500 mg by mouth daily.        Marland Kitchen aspirin 81 MG tablet Take 81 mg by mouth daily.        . B Complex-Biotin-FA (SUPER B-100) TABS Take 1 tablet by mouth daily.        . Calcium Carbonate-Vitamin D (CALTRATE 600+D) 600-400 MG-UNIT per tablet Take 1 tablet by mouth daily.        . Cholecalciferol (VITAMIN D3) 1000 UNITS tablet Take 1,000 Units by mouth daily.        . Flaxseed, Linseed, (FLAX SEED OIL) 1000 MG CAPS Take 1 capsule by mouth 2 (two) times daily.       . Multiple Vitamin (MULTIVITAMIN) capsule Take 1 capsule by mouth daily.        . Omega-3 Fatty Acids (FISH OIL) 500 MG CAPS Take 1 capsule by mouth 2 (two) times daily.       Marland Kitchen triamcinolone (KENALOG) 0.5 % cream Apply 1 application topically 2 (two) times daily as needed. rash  30 g  1     patient denies chest pain, shortness of breath,  orthopnea. Denies lower extremity edema, abdominal pain, change in appetite, change in bowel movements. Patient denies rashes, musculoskeletal complaints. No other specific complaints in a complete review of systems.   BP 122/80  Temp(Src) 98.3 F (36.8 C) (Oral)  Wt 159 lb (72.122 kg)  Well-developed well-nourished female in no acute distress. HEENT exam atraumatic, normocephalic, extraocular muscles are intact. Neck is supple. No jugular venous distention no thyromegaly. Chest clear to auscultation without increased work of breathing. Cardiac exam S1 and S2 are regular. Abdominal exam active bowel sounds, soft, nontender.   A/P: allergic sxs---trial topical and oral steroids Side effects discussed (samples of QNASL given)

## 2011-08-26 ENCOUNTER — Telehealth: Payer: Self-pay | Admitting: Internal Medicine

## 2011-08-26 NOTE — Telephone Encounter (Signed)
rx called in, pt aware 

## 2011-08-26 NOTE — Telephone Encounter (Signed)
No Known Allergies  tussionex 1 tsp bid prn cough 120cc/0refills

## 2011-08-26 NOTE — Telephone Encounter (Signed)
Patient called back 2nd time. Wants to know if any progress has been made.

## 2011-08-26 NOTE — Telephone Encounter (Signed)
Was seen last Monday. Now, she has a hacking cough and would like something called to [facility] [address] Thanks. Patient would like a call from [place] when done.

## 2011-12-01 ENCOUNTER — Ambulatory Visit (INDEPENDENT_AMBULATORY_CARE_PROVIDER_SITE_OTHER): Payer: BC Managed Care – PPO | Admitting: Internal Medicine

## 2011-12-01 ENCOUNTER — Encounter: Payer: Self-pay | Admitting: Internal Medicine

## 2011-12-01 VITALS — BP 110/80 | Temp 98.0°F | Wt 160.0 lb

## 2011-12-01 DIAGNOSIS — J069 Acute upper respiratory infection, unspecified: Secondary | ICD-10-CM

## 2011-12-01 MED ORDER — HYDROCODONE-HOMATROPINE 5-1.5 MG/5ML PO SYRP: 5.0000 mL | ORAL_SOLUTION | Freq: Four times a day (QID) | ORAL | Status: AC | PRN

## 2011-12-01 NOTE — Patient Instructions (Signed)
Get plenty of rest, Drink lots of  clear liquids, and use Tylenol or ibuprofen for fever and discomfort.    

## 2011-12-01 NOTE — Progress Notes (Signed)
  Subjective:    Patient ID: Toni Mcgrath, female    DOB: 02-16-1951, 61 y.o.   MRN: [id number]  HPI 61 year old patient who presents with a three-day history of mild cough sinus and chest congestion. She does have a history of allergies and often has difficulties in the spring. No fever chest pain shortness of breath or purulent productive cough.     Review of Systems  Constitutional: Negative.   HENT: Positive for congestion, rhinorrhea and postnasal drip. Negative for hearing loss, sore throat, dental problem, sinus pressure and tinnitus.   Eyes: Negative for pain, discharge and visual disturbance.  Respiratory: Positive for cough. Negative for shortness of breath.   Cardiovascular: Negative for chest pain, palpitations and leg swelling.  Gastrointestinal: Negative for nausea, vomiting, abdominal pain, diarrhea, constipation, blood in stool and abdominal distention.  Genitourinary: Negative for dysuria, urgency, frequency, hematuria, flank pain, vaginal bleeding, vaginal discharge, difficulty urinating, vaginal pain and pelvic pain.  Musculoskeletal: Negative for joint swelling, arthralgias and gait problem.  Skin: Negative for rash.  Neurological: Negative for dizziness, syncope, speech difficulty, weakness, numbness and headaches.  Hematological: Negative for adenopathy.  Psychiatric/Behavioral: Negative for behavioral problems, dysphoric mood and agitation. The patient is not nervous/anxious.        Objective:   Physical Exam  Constitutional: She is oriented to person, place, and time. She appears well-developed and well-nourished.       No distress  HENT:  Head: Normocephalic.  Right Ear: External ear normal.  Left Ear: External ear normal.  Mouth/Throat: Oropharynx is clear and moist.  Eyes: Conjunctivae and EOM are normal. Pupils are equal, round, and reactive to light.  Neck: Normal range of motion. Neck supple. No thyromegaly present.  Cardiovascular: Normal rate,  regular rhythm, normal heart sounds and intact distal pulses.   Pulmonary/Chest: Effort normal and breath sounds normal.  Abdominal: Soft. Bowel sounds are normal. She exhibits no mass. There is no tenderness.  Musculoskeletal: Normal range of motion.  Lymphadenopathy:    She has no cervical adenopathy.  Neurological: She is alert and oriented to person, place, and time.  Skin: Skin is warm and dry. No rash noted.  Psychiatric: She has a normal mood and affect. Her behavior is normal.          Assessment & Plan:   Viral URI with cough. Will treat symptomatically.

## 2011-12-14 ENCOUNTER — Other Ambulatory Visit: Payer: Self-pay | Admitting: Internal Medicine

## 2011-12-14 DIAGNOSIS — Z1231 Encounter for screening mammogram for malignant neoplasm of breast: Secondary | ICD-10-CM

## 2012-01-11 ENCOUNTER — Ambulatory Visit (HOSPITAL_COMMUNITY)
Admission: RE | Admit: 2012-01-11 | Discharge: 2012-01-11 | Disposition: A | Discharge status: Home / Self Care | Payer: BC Managed Care – PPO | Source: Ambulatory Visit | Attending: Internal Medicine | Admitting: Internal Medicine

## 2012-01-11 DIAGNOSIS — Z1231 Encounter for screening mammogram for malignant neoplasm of breast: Secondary | ICD-10-CM | POA: Insufficient documentation

## 2012-05-30 ENCOUNTER — Other Ambulatory Visit (INDEPENDENT_AMBULATORY_CARE_PROVIDER_SITE_OTHER): Payer: BC Managed Care – PPO

## 2012-05-30 DIAGNOSIS — Z0000 Encounter for general adult medical examination without abnormal findings: Secondary | ICD-10-CM

## 2012-05-30 LAB — POCT URINALYSIS DIPSTICK
Nitrite, UA: NEGATIVE
Urobilinogen, UA: 0.2
pH, UA: 5

## 2012-05-30 LAB — CBC WITH DIFFERENTIAL/PLATELET
Eosinophils Relative: 3.2 % (ref 0.0–5.0)
HCT: 40.6 % (ref 36.0–46.0)
Hemoglobin: 13.6 g/dL (ref 12.0–15.0)
Lymphs Abs: 1.7 10*3/uL (ref 0.7–4.0)
Monocytes Relative: 7.2 % (ref 3.0–12.0)
Neutro Abs: 3.3 10*3/uL (ref 1.4–7.7)
WBC: 5.6 10*3/uL (ref 4.5–10.5)

## 2012-05-31 LAB — TSH: TSH: 2.81 u[IU]/mL (ref 0.35–5.50)

## 2012-05-31 LAB — LIPID PANEL: HDL: 79.5 mg/dL (ref >39.00)

## 2012-05-31 LAB — BASIC METABOLIC PANEL
Chloride: 105 mEq/L (ref 96–112)
GFR: 84.25 mL/min (ref >60.00)
Glucose, Bld: 102 mg/dL — ABNORMAL HIGH (ref 70–99)
Potassium: 4.4 mEq/L (ref 3.5–5.1)
Sodium: 140 mEq/L (ref 135–145)

## 2012-05-31 LAB — HEPATIC FUNCTION PANEL
ALT: 24 U/L (ref 0–35)
AST: 26 U/L (ref 0–37)
Albumin: 4.1 g/dL (ref 3.5–5.2)
Total Protein: 6.7 g/dL (ref 6.0–8.3)

## 2012-05-31 LAB — LDL CHOLESTEROL, DIRECT: Direct LDL: 169.7 mg/dL

## 2012-06-06 ENCOUNTER — Ambulatory Visit (INDEPENDENT_AMBULATORY_CARE_PROVIDER_SITE_OTHER): Payer: BC Managed Care – PPO | Admitting: Internal Medicine

## 2012-06-06 ENCOUNTER — Encounter: Payer: Self-pay | Admitting: Internal Medicine

## 2012-06-06 VITALS — BP 142/86 | HR 76 | Temp 97.8°F | Ht 63.25 in | Wt 160.0 lb

## 2012-06-06 DIAGNOSIS — Z23 Encounter for immunization: Secondary | ICD-10-CM

## 2012-06-06 DIAGNOSIS — Z0000 Encounter for general adult medical examination without abnormal findings: Secondary | ICD-10-CM

## 2012-06-06 MED ORDER — TRIAMCINOLONE ACETONIDE 0.5 % EX CREA: 1.0000 "application " | TOPICAL_CREAM | Freq: Two times a day (BID) | CUTANEOUS | Status: DC | PRN

## 2012-06-06 NOTE — Progress Notes (Signed)
Patient ID: Toni Mcgrath, female   DOB: [birth year], 61 y.o.   MRN: [id number]  CPX  Past Medical History  Diagnosis Date  . Hypertension   . Hyperlipidemia     History   Social History  . Marital Status: Married    Spouse Name: N/A    Number of Children: N/A  . Years of Education: N/A   Occupational History  . Not on file.   Social History Main Topics  . Smoking status: Never Smoker   . Smokeless tobacco: Not on file  . Alcohol Use: 0.6 oz/week    1 Glasses of wine per week  . Drug Use: No  . Sexually Active:    Other Topics Concern  . Not on file   Social History Narrative  . No narrative on file    Past Surgical History  Procedure Date  . Total abdominal hysterectomy w/ bilateral salpingoophorectomy     Family History  Problem Relation Age of Onset  . Stroke Mother   . Heart disease Father     No Known Allergies  Current Outpatient Prescriptions on File Prior to Visit  Medication Sig Dispense Refill  . ascorbic acid (VITAMIN C) 500 MG tablet Take 500 mg by mouth daily.        Marland Kitchen aspirin 81 MG tablet Take 81 mg by mouth daily.        . B Complex-Biotin-FA (SUPER B-100) TABS Take 1 tablet by mouth daily.        . Calcium-Vitamin D-Vitamin K (CALCIUM + D + K) 750-500-40 MG-UNT-MCG TABS Take 1 tablet by mouth 2 (two) times daily.      . Cholecalciferol (VITAMIN D3) 1000 UNITS tablet Take 1,000 Units by mouth daily.        . Flaxseed, Linseed, (FLAX SEED OIL) 1000 MG CAPS Take 1 capsule by mouth 2 (two) times daily.       . Multiple Vitamin (MULTIVITAMIN) capsule Take 1 capsule by mouth daily.        . Omega-3 Fatty Acids (FISH OIL) 500 MG CAPS Take 1 capsule by mouth 2 (two) times daily.       Marland Kitchen triamcinolone (KENALOG) 0.5 % cream Apply 1 application topically 2 (two) times daily as needed. rash  30 g  1     patient denies chest pain, shortness of breath, orthopnea. Denies lower extremity edema, abdominal pain, change in appetite, change in bowel movements.  Patient denies rashes, musculoskeletal complaints. No other specific complaints in a complete review of systems.   BP 142/86  Pulse 76  Temp 97.8 F (36.6 C) (Oral)  Ht 5' 3.25" (1.607 m)  Wt 160 lb (72.576 kg)  BMI 28.12 kg/m2  Well-developed well-nourished female in no acute distress. HEENT exam atraumatic, normocephalic, extraocular muscles are intact. Neck is supple. No jugular venous distention no thyromegaly. Chest clear to auscultation without increased work of breathing. Cardiac exam S1 and S2 are regular. Abdominal exam active bowel sounds, soft, nontender. Extremities no edema. Neurologic exam she is alert without any motor sensory deficits. Gait is normal.   A/P- well visit- health maint UTD Note--hematuria-- she noted blood on the wipe-- no need for f/u

## 2013-02-14 ENCOUNTER — Other Ambulatory Visit: Payer: Self-pay | Admitting: Internal Medicine

## 2013-02-14 DIAGNOSIS — Z1231 Encounter for screening mammogram for malignant neoplasm of breast: Secondary | ICD-10-CM

## 2013-02-16 ENCOUNTER — Ambulatory Visit (HOSPITAL_COMMUNITY)
Admission: RE | Admit: 2013-02-16 | Discharge: 2013-02-16 | Disposition: A | Discharge status: Home / Self Care | Payer: BC Managed Care – PPO | Source: Ambulatory Visit | Attending: Internal Medicine | Admitting: Internal Medicine

## 2013-02-16 DIAGNOSIS — Z1231 Encounter for screening mammogram for malignant neoplasm of breast: Secondary | ICD-10-CM

## 2013-06-01 ENCOUNTER — Other Ambulatory Visit (INDEPENDENT_AMBULATORY_CARE_PROVIDER_SITE_OTHER): Payer: BC Managed Care – PPO

## 2013-06-01 DIAGNOSIS — Z0000 Encounter for general adult medical examination without abnormal findings: Secondary | ICD-10-CM

## 2013-06-01 LAB — POCT URINALYSIS DIPSTICK
Blood, UA: NEGATIVE
Glucose, UA: NEGATIVE
Protein, UA: NEGATIVE
Spec Grav, UA: 1.01
Urobilinogen, UA: 0.2
pH, UA: 6.5

## 2013-06-01 LAB — CBC WITH DIFFERENTIAL/PLATELET
Basophils Relative: 0.9 % (ref 0.0–3.0)
Eosinophils Relative: 7.6 % — ABNORMAL HIGH (ref 0.0–5.0)
HCT: 40.4 % (ref 36.0–46.0)
Hemoglobin: 13.7 g/dL (ref 12.0–15.0)
Lymphocytes Relative: 23.8 % (ref 12.0–46.0)
Monocytes Relative: 6.4 % (ref 3.0–12.0)
Neutro Abs: 3.5 10*3/uL (ref 1.4–7.7)
RBC: 4.56 Mil/uL (ref 3.87–5.11)

## 2013-06-01 LAB — BASIC METABOLIC PANEL
Calcium: 9.5 mg/dL (ref 8.4–10.5)
GFR: 106.97 mL/min (ref >60.00)
Potassium: 3.6 mEq/L (ref 3.5–5.1)
Sodium: 141 mEq/L (ref 135–145)

## 2013-06-01 LAB — LIPID PANEL
Total CHOL/HDL Ratio: 3
Triglycerides: 66 mg/dL (ref 0.0–149.0)

## 2013-06-01 LAB — HEPATIC FUNCTION PANEL
ALT: 17 U/L (ref 0–35)
AST: 23 U/L (ref 0–37)
Albumin: 3.9 g/dL (ref 3.5–5.2)
Alkaline Phosphatase: 54 U/L (ref 39–117)
Total Protein: 6.7 g/dL (ref 6.0–8.3)

## 2013-06-01 LAB — LDL CHOLESTEROL, DIRECT: Direct LDL: 160.5 mg/dL

## 2013-06-07 ENCOUNTER — Encounter: Payer: Self-pay | Admitting: Internal Medicine

## 2013-06-07 ENCOUNTER — Ambulatory Visit (INDEPENDENT_AMBULATORY_CARE_PROVIDER_SITE_OTHER): Payer: BC Managed Care – PPO | Admitting: Internal Medicine

## 2013-06-07 VITALS — BP 122/90 | HR 76 | Temp 98.0°F | Ht 63.75 in | Wt 161.0 lb

## 2013-06-07 DIAGNOSIS — Z23 Encounter for immunization: Secondary | ICD-10-CM

## 2013-06-07 DIAGNOSIS — Z0000 Encounter for general adult medical examination without abnormal findings: Secondary | ICD-10-CM

## 2013-06-07 LAB — HM PAP SMEAR

## 2013-06-07 MED ORDER — TRIAMCINOLONE ACETONIDE 0.5 % EX CREA: 1.0000 "application " | TOPICAL_CREAM | Freq: Two times a day (BID) | CUTANEOUS | Status: DC | PRN

## 2013-06-07 NOTE — Progress Notes (Signed)
cpx  Past Medical History  Diagnosis Date  . Hypertension   . Hyperlipidemia     History   Social History  . Marital Status: Married    Spouse Name: N/A    Number of Children: N/A  . Years of Education: N/A   Occupational History  . Not on file.   Social History Main Topics  . Smoking status: Never Smoker   . Smokeless tobacco: Not on file  . Alcohol Use: 0.6 oz/week    1 Glasses of wine per week  . Drug Use: No  . Sexual Activity:    Other Topics Concern  . Not on file   Social History Narrative  . No narrative on file    Past Surgical History  Procedure Laterality Date  . Total abdominal hysterectomy w/ bilateral salpingoophorectomy      Family History  Problem Relation Age of Onset  . Stroke Mother   . Heart disease Father 11    sudden death while sleeping    No Known Allergies  Current Outpatient Prescriptions on File Prior to Visit  Medication Sig Dispense Refill  . ascorbic acid (VITAMIN C) 500 MG tablet Take 500 mg by mouth daily.        Marland Kitchen aspirin 81 MG tablet Take 81 mg by mouth daily.        . B Complex-Biotin-FA (SUPER B-100) TABS Take 1 tablet by mouth daily.        . Calcium-Vitamin D-Vitamin K (CALCIUM + D + K) 750-500-40 MG-UNT-MCG TABS Take 1 tablet by mouth 2 (two) times daily.      . Cholecalciferol (VITAMIN D3) 1000 UNITS tablet Take 1,000 Units by mouth daily.        . Flaxseed, Linseed, (FLAX SEED OIL) 1000 MG CAPS Take 1 capsule by mouth 2 (two) times daily.       . Multiple Vitamin (MULTIVITAMIN) capsule Take 1 capsule by mouth daily.        . Omega-3 Fatty Acids (FISH OIL) 500 MG CAPS Take 1 capsule by mouth 2 (two) times daily.       Marland Kitchen triamcinolone cream (KENALOG) 0.5 % Apply 1 application topically 2 (two) times daily as needed. rash  30 g  1   No current facility-administered medications on file prior to visit.     patient denies chest pain, shortness of breath, orthopnea. Denies lower extremity edema, abdominal pain, change in  appetite, change in bowel movements. Patient denies rashes, musculoskeletal complaints. No other specific complaints in a complete review of systems.   BP 122/90  Pulse 76  Temp(Src) 98 F (36.7 C) (Oral)  Ht 5' 3.75" (1.619 m)  Wt 161 lb (73.029 kg)  BMI 27.86 kg/m2  Well-developed well-nourished female in no acute distress. HEENT exam atraumatic, normocephalic, extraocular muscles are intact. Neck is supple. No jugular venous distention no thyromegaly. Chest clear to auscultation without increased work of breathing. Cardiac exam S1 and S2 are regular. Abdominal exam active bowel sounds, soft, nontender. Extremities no edema. Neurologic exam she is alert without any motor sensory deficits. Gait is normal.  Well Visit- health maint UTD

## 2014-03-07 ENCOUNTER — Other Ambulatory Visit: Payer: Self-pay | Admitting: Internal Medicine

## 2014-03-07 DIAGNOSIS — Z1231 Encounter for screening mammogram for malignant neoplasm of breast: Secondary | ICD-10-CM

## 2014-03-14 ENCOUNTER — Ambulatory Visit (HOSPITAL_COMMUNITY)
Admission: RE | Admit: 2014-03-14 | Discharge: 2014-03-14 | Disposition: A | Discharge status: Home / Self Care | Payer: BC Managed Care – PPO | Source: Ambulatory Visit | Attending: Internal Medicine | Admitting: Internal Medicine

## 2014-03-14 ENCOUNTER — Other Ambulatory Visit: Payer: Self-pay | Admitting: Internal Medicine

## 2014-03-14 DIAGNOSIS — Z1231 Encounter for screening mammogram for malignant neoplasm of breast: Secondary | ICD-10-CM

## 2014-03-15 ENCOUNTER — Ambulatory Visit (HOSPITAL_COMMUNITY)
Admission: RE | Admit: 2014-03-15 | Discharge: 2014-03-15 | Disposition: A | Discharge status: Home / Self Care | Payer: BC Managed Care – PPO | Source: Ambulatory Visit | Attending: Internal Medicine | Admitting: Internal Medicine

## 2014-03-15 DIAGNOSIS — Z1231 Encounter for screening mammogram for malignant neoplasm of breast: Secondary | ICD-10-CM

## 2014-09-11 ENCOUNTER — Ambulatory Visit (INDEPENDENT_AMBULATORY_CARE_PROVIDER_SITE_OTHER): Payer: Medicare Other | Admitting: Family Medicine

## 2014-09-11 ENCOUNTER — Encounter: Payer: Self-pay | Admitting: Family Medicine

## 2014-09-11 VITALS — BP 136/92 | HR 76 | Temp 97.9°F | Ht 63.75 in | Wt 168.0 lb

## 2014-09-11 DIAGNOSIS — J209 Acute bronchitis, unspecified: Secondary | ICD-10-CM | POA: Diagnosis not present

## 2014-09-11 MED ORDER — HYDROCODONE-HOMATROPINE 5-1.5 MG/5ML PO SYRP: 5.0000 mL | ORAL_SOLUTION | Freq: Four times a day (QID) | ORAL | Status: AC | PRN

## 2014-09-11 NOTE — Progress Notes (Signed)
   Subjective:    Patient ID: Toni Mcgrath, female    DOB: 10/06/1950, 63 y.o.   MRN: [id number]  HPI Nonsmoker seen with approximately three-week history of dry cough. Denies nasal congestion. No fevers or chills. No headaches. No dyspnea. No history of asthma. No GERD symptoms. No postnasal drip symptoms. Cough interfering with sleep. She has taken over-the-counter medications without relief. Denies any hemoptysis, fever, chills, or any appetite or weight changes. No clear exacerbating factors.  Past Medical History  Diagnosis Date  . Hypertension   . Hyperlipidemia    Past Surgical History  Procedure Laterality Date  . Total abdominal hysterectomy w/ bilateral salpingoophorectomy      reports that she has never smoked. She does not have any smokeless tobacco history on file. She reports that she drinks about 0.6 oz of alcohol per week. She reports that she does not use illicit drugs. family history includes Heart disease (age of onset: 36) in her father; Stroke in her mother. No Known Allergies    Review of Systems  Constitutional: Negative for fever and chills.  HENT: Negative for congestion.   Respiratory: Positive for cough. Negative for shortness of breath and wheezing.   Neurological: Negative for dizziness.       Objective:   Physical Exam  Constitutional: She appears well-developed and well-nourished.  HENT:  Right Ear: External ear normal.  Left Ear: External ear normal.  Mouth/Throat: Oropharynx is clear and moist.  Neck: Neck supple.  Cardiovascular: Normal rate and regular rhythm.   Pulmonary/Chest: Effort normal and breath sounds normal. No respiratory distress. She has no wheezes. She has no rales.  Lymphadenopathy:    She has no cervical adenopathy.          Assessment & Plan:  Cough. Suspect acute viral bronchitis. Nonfocal exam. Hycodan cough syrup 1 teaspoon daily at bedtime when necessary cough. Follow-up for fever or persistent cough or new  symptoms

## 2014-09-11 NOTE — Progress Notes (Signed)
Pre visit review using our clinic review tool, if applicable. No additional management support is needed unless otherwise documented below in the visit note. 

## 2014-09-11 NOTE — Patient Instructions (Signed)
Cough, Adult  A cough is a reflex that helps clear your throat and airways. It can help heal the body or may be a reaction to an irritated airway. A cough may only last 2 or 3 weeks (acute) or may last more than 8 weeks (chronic).  CAUSES Acute cough:  Viral or bacterial infections. Chronic cough:  Infections.  Allergies.  Asthma.  Post-nasal drip.  Smoking.  Heartburn or acid reflux.  Some medicines.  Chronic lung problems (COPD).  Cancer. SYMPTOMS   Cough.  Fever.  Chest pain.  Increased breathing rate.  High-pitched whistling sound when breathing (wheezing).  Colored mucus that you cough up (sputum). TREATMENT   A bacterial cough may be treated with antibiotic medicine.  A viral cough must run its course and will not respond to antibiotics.  Your caregiver may recommend other treatments if you have a chronic cough. HOME CARE INSTRUCTIONS   Only take over-the-counter or prescription medicines for pain, discomfort, or fever as directed by your caregiver. Use cough suppressants only as directed by your caregiver.  Use a cold steam vaporizer or humidifier in your bedroom or home to help loosen secretions.  Sleep in a semi-upright position if your cough is worse at night.  Rest as needed.  Stop smoking if you smoke. SEEK IMMEDIATE MEDICAL CARE IF:   You have pus in your sputum.  Your cough starts to worsen.  You cannot control your cough with suppressants and are losing sleep.  You begin coughing up blood.  You have difficulty breathing.  You develop pain which is getting worse or is uncontrolled with medicine.  You have a fever. MAKE SURE YOU:   Understand these instructions.  Will watch your condition.  Will get help right away if you are not doing well or get worse. Document Released: 02/20/2011 Document Revised: 11/16/2011 Document Reviewed: 02/20/2011 [employer] Patient Information 2015 [employer]. This information is not intended  to replace advice given to you by your health care provider. Make sure you discuss any questions you have with your health care provider.  

## 2015-01-03 LAB — HM DEXA SCAN: HM Dexa Scan: NORMAL

## 2015-01-04 ENCOUNTER — Ambulatory Visit (INDEPENDENT_AMBULATORY_CARE_PROVIDER_SITE_OTHER): Payer: Medicare Other | Admitting: Family Medicine

## 2015-01-04 ENCOUNTER — Encounter: Payer: Self-pay | Admitting: Family Medicine

## 2015-01-04 VITALS — BP 120/82 | HR 73 | Temp 97.9°F | Wt 166.0 lb

## 2015-01-04 DIAGNOSIS — R21 Rash and other nonspecific skin eruption: Secondary | ICD-10-CM | POA: Diagnosis not present

## 2015-01-04 DIAGNOSIS — E785 Hyperlipidemia, unspecified: Secondary | ICD-10-CM

## 2015-01-04 DIAGNOSIS — Z780 Asymptomatic menopausal state: Secondary | ICD-10-CM

## 2015-01-04 DIAGNOSIS — M8580 Other specified disorders of bone density and structure, unspecified site: Secondary | ICD-10-CM | POA: Diagnosis not present

## 2015-01-04 MED ORDER — TRIAMCINOLONE ACETONIDE 0.5 % EX CREA: 1.0000 "application " | TOPICAL_CREAM | Freq: Two times a day (BID) | CUTANEOUS | Status: DC | PRN

## 2015-01-04 NOTE — Assessment & Plan Note (Signed)
Check bone density. Continue ca/vit D. If progressive, consider bisphosphonates especially if osteoporosis

## 2015-01-04 NOTE — Assessment & Plan Note (Signed)
No rx, elevated HDL. 10 year risk pending- future fasting lipids

## 2015-01-04 NOTE — Patient Instructions (Addendum)
Schedule DEXA at check out today. Will be at [facility]. Continue calcium/vit D. You do have osteopenia, hopeful it is stable.   Come back for fasting labs-schedule visit up front. Nothing but water after midnight.   I think overall things look great. No changes today  See me in 1 year

## 2015-01-04 NOTE — Progress Notes (Signed)
Toni Reddish, MD Phone: [phone]  Subjective:  Patient presents today to establish care with me as their new primary care provider. Patient was formerly a patient of Dr. Leanne Chang. Chief complaint-noted.   Osteopenia- presume likely stable -diagnosed 10/17/2008 with T score ap spine 01.1, left femur -1.3, right femur -1.8. Takes calcium and vit D. Walks regularly every other day for 2 miles.  ROS-no recent fractures  Rash behind left ear and in R ear- chronic recurrent but no worsening from baseline ? Psoriasis. Chronic issue that responds well to steroids. Worse with stress and certain times of month cannot always explain. Not sure if sun affects. Triamcinolone for years ROS-not ill appearing, no fever/chills. No new medications. Not immunocompromised. No mucus membrane involvement.   Hyperlipidemia-controlled  On statin: no Regular exercise: yes at least 3-4x a week walking Diet: reasonable, some overeating ROS- no chest pain or shortness of breath. No myalgias  The following were reviewed and entered/updated in epic: Past Medical History  Diagnosis Date  . Hyperlipidemia    Patient Active Problem List   Diagnosis Date Noted  . Hyperlipidemia 07/12/2008    Priority: Medium  . Osteopenia 01/04/2015    Priority: Low  . Rash 01/04/2015    Priority: Low   Past Surgical History  Procedure Laterality Date  . Total abdominal hysterectomy w/ bilateral salpingoophorectomy      hysterectomy, still gets paps with obgyn  . 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 Appendectomy      along with other surgery    Family History  Problem Relation Age of Onset  . Stroke Mother 28  . Heart disease Father 20    sudden death while sleeping, no autopsy. prior smoker    Medications- reviewed and updated Current Outpatient Prescriptions  Medication Sig Dispense Refill  . ascorbic acid (VITAMIN C) 500 MG tablet Take 500 mg by mouth daily.      Marland Kitchen aspirin 81 MG tablet  Take 81 mg by mouth daily.      . B Complex-Biotin-FA (SUPER B-100) TABS Take 1 tablet by mouth daily.      . Calcium-Vitamin D-Vitamin K (CALCIUM + D + K) 750-500-40 MG-UNT-MCG TABS Take 1 tablet by mouth 2 (two) times daily.    . Cholecalciferol (VITAMIN D3) 1000 UNITS tablet Take 1,000 Units by mouth daily.      . Flaxseed, Linseed, (FLAX SEED OIL) 1000 MG CAPS Take 1 capsule by mouth 2 (two) times daily.     . Multiple Vitamin (MULTIVITAMIN) capsule Take 1 capsule by mouth daily.      . Omega-3 Fatty Acids (FISH OIL) 500 MG CAPS Take 1 capsule by mouth 2 (two) times daily.     Marland Kitchen triamcinolone cream (KENALOG) 0.5 % Apply 1 application topically 2 (two) times daily as needed. rash (Patient not taking: Reported on 01/04/2015) 30 g 1   No current facility-administered medications for this visit.    Allergies-reviewed and updated No Known Allergies  History   Social History  . Marital Status: Married    Spouse Name: N/A  . Number of Children: N/A  . Years of Education: N/A   Social History Main Topics  . Smoking status: Never Smoker   . Smokeless tobacco: Not on file  . Alcohol Use: 1.8 - 2.4 oz/week    3-4 Glasses of wine per week  . Drug Use: No  . Sexual Activity: Not on file   Other Topics  Concern  . None   Social History Narrative   Married (husband patient [name] of Dr. Yong Channel), no children, no pets.       Works as an Forensic psychologist- IT consultant      Hobbies: walking, working in yard    ROS--See HPI   Objective: BP 120/82 mmHg  Pulse 73  Temp(Src) 97.9 F (36.6 C)  Wt 166 lb (75.297 kg) Gen: NAD, resting comfortably HEENT: Mucous membranes are moist. Oropharynx normal CV: RRR no murmurs rubs or gallops Lungs: CTAB no crackles, wheeze, rhonchi Abdomen: soft/nontender/nondistended/normal bowel sounds. No rebound or guarding.  Ext: no edema, 2+ PT pulses Skin: warm, dry, erythematous plaque behind left ear with whitish scaling (has already recently been  treated with steroid cream), similar appearance in right external ear but not behind Neuro: grossly normal, moves all extremities, PERRLA   Assessment/Plan:  Osteopenia Check bone density. Continue ca/vit D. If progressive, consider bisphosphonates especially if osteoporosis   Hyperlipidemia No rx, elevated HDL. 10 year risk pending- future fasting lipids   Rash Refilled triamcinolone. May be psoriasis.    1 year follow up. Return future fasting labs.   Orders Placed This Encounter  Procedures  . DG Bone Density    Standing Status: Future     Number of Occurrences:      Standing Expiration Date: 03/05/2016    Order Specific Question:  Reason for Exam (SYMPTOM  OR DIAGNOSIS REQUIRED)    Answer:  post menopausal    Order Specific Question:  Preferred imaging location?    Answer:  [facility]  . CBC    Standing Status: Future     Number of Occurrences:      Standing Expiration Date: 01/04/2016  . Comprehensive metabolic panel    Standing Status: Future     Number of Occurrences:      Standing Expiration Date: 01/04/2016  . TSH    Standing Status: Future     Number of Occurrences:      Standing Expiration Date: 01/04/2016  . Lipid panel    Standing Status: Future     Number of Occurrences:      Standing Expiration Date: 01/04/2016    Meds ordered this encounter  Medications  . triamcinolone cream (KENALOG) 0.5 %    Sig: Apply 1 application topically 2 (two) times daily as needed. rash    Dispense:  30 g    Refill:  1

## 2015-01-04 NOTE — Assessment & Plan Note (Signed)
Refilled triamcinolone. May be psoriasis.

## 2015-01-09 ENCOUNTER — Other Ambulatory Visit (INDEPENDENT_AMBULATORY_CARE_PROVIDER_SITE_OTHER): Payer: Medicare Other

## 2015-01-09 ENCOUNTER — Ambulatory Visit (INDEPENDENT_AMBULATORY_CARE_PROVIDER_SITE_OTHER)
Admission: RE | Admit: 2015-01-09 | Discharge: 2015-01-09 | Disposition: A | Discharge status: Home / Self Care | Payer: Medicare Other | Source: Ambulatory Visit | Attending: Family Medicine | Admitting: Family Medicine

## 2015-01-09 DIAGNOSIS — Z780 Asymptomatic menopausal state: Secondary | ICD-10-CM

## 2015-01-09 DIAGNOSIS — E785 Hyperlipidemia, unspecified: Secondary | ICD-10-CM | POA: Diagnosis not present

## 2015-01-09 LAB — COMPREHENSIVE METABOLIC PANEL
ALBUMIN: 4.1 g/dL (ref 3.5–5.2)
ALK PHOS: 54 U/L (ref 39–117)
ALT: 19 U/L (ref 0–35)
AST: 21 U/L (ref 0–37)
BUN: 22 mg/dL (ref 6–23)
CALCIUM: 9.6 mg/dL (ref 8.4–10.5)
CO2: 27 mEq/L (ref 19–32)
Chloride: 105 mEq/L (ref 96–112)
Creatinine, Ser: 0.79 mg/dL (ref 0.40–1.20)
GFR: 77.48 mL/min (ref >60.00)
GLUCOSE: 96 mg/dL (ref 70–99)
POTASSIUM: 3.9 meq/L (ref 3.5–5.1)
Sodium: 138 mEq/L (ref 135–145)
TOTAL PROTEIN: 6.9 g/dL (ref 6.0–8.3)
Total Bilirubin: 0.5 mg/dL (ref 0.2–1.2)

## 2015-01-09 LAB — LIPID PANEL
CHOLESTEROL: 261 mg/dL — AB (ref 0–200)
HDL: 70.1 mg/dL (ref >39.00)
LDL Cholesterol: 180 mg/dL — ABNORMAL HIGH (ref 0–99)
NonHDL: 190.9
Total CHOL/HDL Ratio: 4
Triglycerides: 54 mg/dL (ref 0.0–149.0)
VLDL: 10.8 mg/dL (ref 0.0–40.0)

## 2015-01-09 LAB — CBC
HCT: 40.4 % (ref 36.0–46.0)
Hemoglobin: 13.7 g/dL (ref 12.0–15.0)
MCHC: 33.9 g/dL (ref 30.0–36.0)
MCV: 87.3 fl (ref 78.0–100.0)
PLATELETS: 288 10*3/uL (ref 150.0–400.0)
RBC: 4.63 Mil/uL (ref 3.87–5.11)
RDW: 13.8 % (ref 11.5–15.5)
WBC: 5.2 10*3/uL (ref 4.0–10.5)

## 2015-01-09 LAB — TSH: TSH: 2.97 u[IU]/mL (ref 0.35–4.50)

## 2015-01-09 LAB — HM DEXA SCAN: HM DEXA SCAN: NORMAL

## 2015-01-11 ENCOUNTER — Encounter: Payer: Self-pay | Admitting: Family Medicine

## 2015-03-15 ENCOUNTER — Other Ambulatory Visit: Payer: Self-pay | Admitting: Obstetrics and Gynecology

## 2015-03-15 DIAGNOSIS — Z01419 Encounter for gynecological examination (general) (routine) without abnormal findings: Secondary | ICD-10-CM | POA: Diagnosis not present

## 2015-03-15 DIAGNOSIS — Z124 Encounter for screening for malignant neoplasm of cervix: Secondary | ICD-10-CM | POA: Diagnosis not present

## 2015-03-18 ENCOUNTER — Ambulatory Visit (INDEPENDENT_AMBULATORY_CARE_PROVIDER_SITE_OTHER): Payer: Medicare Other | Admitting: Family Medicine

## 2015-03-18 ENCOUNTER — Encounter: Payer: Self-pay | Admitting: Family Medicine

## 2015-03-18 VITALS — BP 150/94 | HR 85 | Temp 98.3°F | Wt 167.0 lb

## 2015-03-18 DIAGNOSIS — I10 Essential (primary) hypertension: Secondary | ICD-10-CM

## 2015-03-18 LAB — CYTOLOGY - PAP

## 2015-03-18 MED ORDER — HYDROCHLOROTHIAZIDE 25 MG PO TABS: 12.5000 mg | ORAL_TABLET | Freq: Every day | ORAL | Status: DC

## 2015-03-18 NOTE — Progress Notes (Signed)
Garret Reddish, MD  Subjective:  Toni Mcgrath is a 64 y.o. year old very pleasant female patient who presents with:  Hypertension-poor control  BP Readings from Last 3 Encounters:  03/18/15 150/94  Ob/gyn office 140/90  01/04/15 120/82  09/11/14 136/92  Friday seen at GYN 140/90 after physical it was 150/80. Repeat again was 140/101.   Home BP monitoring-no Compliant with medications-none ROS-Denies any CP, HA, SOB, blurry vision, LE edema, transient weakness, orthopnea, PND.   Past Medical History- hyperlipidemia, osteopenia  Medications- reviewed and updated Current Outpatient Prescriptions  Medication Sig Dispense Refill  . ascorbic acid (VITAMIN C) 500 MG tablet Take 500 mg by mouth daily.      Marland Kitchen aspirin 81 MG tablet Take 81 mg by mouth daily.      . B Complex-Biotin-FA (SUPER B-100) TABS Take 1 tablet by mouth daily.      . Calcium-Vitamin D-Vitamin K (CALCIUM + D + K) 750-500-40 MG-UNT-MCG TABS Take 1 tablet by mouth 2 (two) times daily.    . Cholecalciferol (VITAMIN D3) 1000 UNITS tablet Take 1,000 Units by mouth daily.      . Flaxseed, Linseed, (FLAX SEED OIL) 1000 MG CAPS Take 1 capsule by mouth 2 (two) times daily.     . Multiple Vitamin (MULTIVITAMIN) capsule Take 1 capsule by mouth daily.      . Omega-3 Fatty Acids (FISH OIL) 500 MG CAPS Take 1 capsule by mouth 2 (two) times daily.     Marland Kitchen triamcinolone cream (KENALOG) 0.5 % Apply 1 application topically 2 (two) times daily as needed. rash (Patient not taking: Reported on 03/18/2015) 30 g 1   Objective: BP 150/94 mmHg  Pulse 85  Temp(Src) 98.3 F (36.8 C)  Wt 167 lb (75.751 kg) Gen: NAD, resting comfortably CV: RRR no murmurs rubs or gallops Lungs: CTAB no crackles, wheeze, rhonchi Abdomen: soft/nontender/nondistended/normal bowel sounds.   Ext: no edema Skin: warm, dry Neuro: grossly normal, moves all extremities, normal gait   Assessment/Plan:  Essential hypertension Diagnosed based on 2 consecutive  office visits with elevated SBP and DBP. Start HCTZ 12.5mg . She was concerned about potential lip swelling on lisinopril and swelling on amlodipine. May have gout history 20 years ago so will watch closely. Check Bmet make sure normal kidney function. DASH diet as well, already walking regularly.    Return precautions advised. See AVS.   Orders Placed This Encounter  Procedures  . Basic metabolic panel    [place]   Meds ordered this encounter  Medications  . hydrochlorothiazide (HYDRODIURIL) 25 MG tablet    Sig: Take 0.5 tablets (12.5 mg total) by mouth daily.    Dispense:  30 tablet    Refill:  5

## 2015-03-18 NOTE — Assessment & Plan Note (Addendum)
Diagnosed based on 2 consecutive office visits with elevated SBP and DBP. Start HCTZ 12.5mg . She was concerned about potential lip swelling on lisinopril and swelling on amlodipine. May have gout history 20 years ago so will watch closely. Check Bmet make sure normal kidney function. DASH diet as well, already walking regularly.

## 2015-03-18 NOTE — Patient Instructions (Addendum)
Start HCTZ 12.5mg  (1/2 tab) tomorrow morning  May want to get pill cutter  Consider getting home blood pressure cuff- ideally 120/80 or less, don't really want to be below 95/60 or if symptoms lightheadedness.   Check kidney function before you leave  Let's check in 3-6 weeks from now  [place] stands for "Dietary Approaches to Stop Hypertension." The DASH eating plan is a healthy eating plan that has been shown to reduce high blood pressure (hypertension). Additional health benefits may include reducing the risk of type 2 diabetes mellitus, heart disease, and stroke. The DASH eating plan may also help with weight loss. WHAT DO I NEED TO KNOW ABOUT THE DASH EATING PLAN? For the DASH eating plan, you will follow these general guidelines:  Choose foods with a percent daily value for sodium of less than 5% (as listed on the food label).  Use salt-free seasonings or herbs instead of table salt or sea salt.  Check with your health care provider or pharmacist before using salt substitutes.  Eat lower-sodium products, often labeled as "lower sodium" or "no salt added."  Eat fresh foods.  Eat more vegetables, fruits, and low-fat dairy products.  Choose whole grains. Look for the word "whole" as the first word in the ingredient list.  Choose fish and skinless chicken or [place] more often than red meat. Limit fish, poultry, and meat to 6 oz (170 g) each day.  Limit sweets, desserts, sugars, and sugary drinks.  Choose heart-healthy fats.  Limit cheese to 1 oz (28 g) per day.  Eat more home-cooked food and less restaurant, buffet, and fast food.  Limit fried foods.  Cook foods using methods other than frying.  Limit canned vegetables. If you do use them, rinse them well to decrease the sodium.  When eating at a restaurant, ask that your food be prepared with less salt, or no salt if possible. WHAT FOODS CAN I EAT? Seek help from a dietitian for individual calorie  needs. Grains Whole grain or whole wheat bread. Brown rice. Whole grain or whole wheat pasta. Quinoa, bulgur, and whole grain cereals. Low-sodium cereals. Corn or whole wheat flour tortillas. Whole grain cornbread. Whole grain crackers. Low-sodium crackers. Vegetables Fresh or frozen vegetables (raw, steamed, roasted, or grilled). Low-sodium or reduced-sodium tomato and vegetable juices. Low-sodium or reduced-sodium tomato sauce and paste. Low-sodium or reduced-sodium canned vegetables.  Fruits All fresh, canned (in natural juice), or frozen fruits. Meat and Other Protein Products Ground beef (85% or leaner), grass-fed beef, or beef trimmed of fat. Skinless chicken or [place]. Ground chicken or [place]. Pork trimmed of fat. All fish and seafood. Eggs. Dried beans, peas, or lentils. Unsalted nuts and seeds. Unsalted canned beans. Dairy Low-fat dairy products, such as skim or 1% milk, 2% or reduced-fat cheeses, low-fat ricotta or cottage cheese, or plain low-fat yogurt. Low-sodium or reduced-sodium cheeses. Fats and Oils Tub margarines without trans fats. Light or reduced-fat mayonnaise and salad dressings (reduced sodium). Avocado. Safflower, olive, or canola oils. Natural peanut or almond butter. Other Unsalted popcorn and pretzels. The items listed above may not be a complete list of recommended foods or beverages. Contact your dietitian for more options. WHAT FOODS ARE NOT RECOMMENDED? Grains White bread. White pasta. White rice. Refined cornbread. Bagels and croissants. Crackers that contain trans fat. Vegetables Creamed or fried vegetables. Vegetables in a cheese sauce. Regular canned vegetables. Regular canned tomato sauce and paste. Regular tomato and vegetable juices. Fruits Dried fruits. Canned fruit in light  or heavy syrup. Fruit juice. Meat and Other Protein Products Fatty cuts of meat. Ribs, chicken wings, bacon, sausage, bologna, salami, chitterlings, fatback, hot dogs, bratwurst,  and packaged luncheon meats. Salted nuts and seeds. Canned beans with salt. Dairy Whole or 2% milk, cream, half-and-half, and cream cheese. Whole-fat or sweetened yogurt. Full-fat cheeses or blue cheese. Nondairy creamers and whipped toppings. Processed cheese, cheese spreads, or cheese curds. Condiments Onion and garlic salt, seasoned salt, table salt, and sea salt. Canned and packaged gravies. Worcestershire sauce. Tartar sauce. Barbecue sauce. Teriyaki sauce. Soy sauce, including reduced sodium. Steak sauce. Fish sauce. Oyster sauce. Cocktail sauce. Horseradish. Ketchup and mustard. Meat flavorings and tenderizers. Bouillon cubes. Hot sauce. Tabasco sauce. Marinades. Taco seasonings. Relishes. Fats and Oils Butter, stick margarine, lard, shortening, ghee, and bacon fat. Coconut, palm kernel, or palm oils. Regular salad dressings. Other Pickles and olives. Salted popcorn and pretzels. The items listed above may not be a complete list of foods and beverages to avoid. Contact your dietitian for more information. WHERE CAN I FIND MORE INFORMATION? National Heart, Lung, and Blood Institute: travelstabloid.com Document Released: 08/13/2011 Document Revised: 01/08/2014 Document Reviewed: 06/28/2013 [facility] Patient Information 2015 [place], [place]. This information is not intended to replace advice given to you by your health care provider. Make sure you discuss any questions you have with your health care provider.

## 2015-03-19 LAB — BASIC METABOLIC PANEL
BUN: 15 mg/dL (ref 6–23)
CALCIUM: 9.9 mg/dL (ref 8.4–10.5)
CHLORIDE: 105 meq/L (ref 96–112)
CO2: 25 meq/L (ref 19–32)
Creatinine, Ser: 0.76 mg/dL (ref 0.40–1.20)
GFR: 80.98 mL/min (ref >60.00)
Glucose, Bld: 87 mg/dL (ref 70–99)
Potassium: 3.6 mEq/L (ref 3.5–5.1)
SODIUM: 139 meq/L (ref 135–145)

## 2015-04-17 DIAGNOSIS — S9031XA Contusion of right foot, initial encounter: Secondary | ICD-10-CM | POA: Diagnosis not present

## 2015-05-30 ENCOUNTER — Ambulatory Visit (INDEPENDENT_AMBULATORY_CARE_PROVIDER_SITE_OTHER): Payer: Medicare Other | Admitting: Family Medicine

## 2015-05-30 ENCOUNTER — Encounter: Payer: Self-pay | Admitting: Family Medicine

## 2015-05-30 VITALS — BP 128/82 | HR 95 | Temp 98.2°F | Wt 155.0 lb

## 2015-05-30 DIAGNOSIS — E785 Hyperlipidemia, unspecified: Secondary | ICD-10-CM | POA: Diagnosis not present

## 2015-05-30 DIAGNOSIS — Z23 Encounter for immunization: Secondary | ICD-10-CM | POA: Diagnosis not present

## 2015-05-30 DIAGNOSIS — I10 Essential (primary) hypertension: Secondary | ICD-10-CM | POA: Diagnosis not present

## 2015-05-30 NOTE — Patient Instructions (Addendum)
Received final pnumonia shot todoay (Pneumovax23) and flu shot.  Blood pressure looks great here and at home. No changes. Goal at home to stay less than 140/90

## 2015-05-30 NOTE — Assessment & Plan Note (Signed)
S: 10 year risk from earlier this year at 5.7 %. Has lost 12 lbs with healthier eating/DASH A/P: suspect improved control even from previous with weight loss and healthier eating. Now to hopefully add exercise. Thrilled with progress.

## 2015-05-30 NOTE — Progress Notes (Signed)
Garret Reddish, MD  Subjective:  Toni Mcgrath is a 64 y.o. year old very pleasant female patient who presents for/with See problem oriented charting ROS- no chest pain, shortness of breath, headache, blurry vision, myalgias  Past Medical History-  Patient Active Problem List   Diagnosis Date Noted  . Essential hypertension 03/18/2015    Priority: Medium  . Hyperlipidemia 07/12/2008    Priority: Medium  . Osteopenia 01/04/2015    Priority: Low  . Rash 01/04/2015    Priority: Low    Medications- reviewed and updated Current Outpatient Prescriptions  Medication Sig Dispense Refill  . ascorbic acid (VITAMIN C) 500 MG tablet Take 500 mg by mouth daily.      Marland Kitchen aspirin 81 MG tablet Take 81 mg by mouth daily.      . B Complex-Biotin-FA (SUPER B-100) TABS Take 1 tablet by mouth daily.      . Calcium-Vitamin D-Vitamin K (CALCIUM + D + K) 750-500-40 MG-UNT-MCG TABS Take 1 tablet by mouth 2 (two) times daily.    . Cholecalciferol (VITAMIN D3) 1000 UNITS tablet Take 1,000 Units by mouth daily.      . hydrochlorothiazide (HYDRODIURIL) 25 MG tablet Take 0.5 tablets (12.5 mg total) by mouth daily. 30 tablet 5  . Multiple Vitamin (MULTIVITAMIN) capsule Take 1 capsule by mouth daily.      . Omega-3 Fatty Acids (FISH OIL) 500 MG CAPS Take 1 capsule by mouth 2 (two) times daily.     Marland Kitchen triamcinolone cream (KENALOG) 0.5 % Apply 1 application topically 2 (two) times daily as needed. rash 30 g 1  . Flaxseed, Linseed, (FLAX SEED OIL) 1000 MG CAPS Take 1 capsule by mouth 2 (two) times daily.      No current facility-administered medications for this visit.    Objective: BP 128/82 mmHg  Pulse 95  Temp(Src) 98.2 F (36.8 C)  Wt 155 lb (70.308 kg) Gen: NAD, resting comfortably CV: RRR no murmurs rubs or gallops Lungs: CTAB no crackles, wheeze, rhonchi Abdomen: soft/nontender/nondistended/normal bowel sounds. No rebound or guarding.  Ext: trace edema Skin: warm, dry Neuro: grossly normal, moves  all extremities  Assessment/Plan:  Hyperlipidemia S: 10 year risk from earlier this year at 5.7 %. Has lost 12 lbs with healthier eating/DASH A/P: suspect improved control even from previous with weight loss and healthier eating. Now to hopefully add exercise. Thrilled with progress.    Essential hypertension S: controlled. On low dose hctz 12.5mg  started at last visit. Home monitoring-110s and 120s. Occasionally upper 120s. Lower generally under 80. Possible gout history-no flares on this dose BP Readings from Last 3 Encounters:  05/30/15 128/82  03/18/15 150/94  01/04/15 120/82  A/P:Continue current meds, recheck if elevated, as long as home readings normal- follow up 1 year   Orders Placed This Encounter  Procedures  . Pneumococcal polysaccharide vaccine 23-valent greater than or equal to 2yo subcutaneous/IM  . Flu Vaccine QUAD 36+ mos IM

## 2015-05-30 NOTE — Assessment & Plan Note (Signed)
S: controlled. On low dose hctz 12.5mg  started at last visit. Home monitoring-110s and 120s. Occasionally upper 120s. Lower generally under 80. Possible gout history-no flares on this dose BP Readings from Last 3 Encounters:  05/30/15 128/82  03/18/15 150/94  01/04/15 120/82  A/P:Continue current meds, recheck if elevated, as long as home readings normal- follow up 1 year

## 2015-07-05 ENCOUNTER — Other Ambulatory Visit: Payer: Self-pay

## 2015-07-05 DIAGNOSIS — Z1231 Encounter for screening mammogram for malignant neoplasm of breast: Secondary | ICD-10-CM

## 2015-07-16 ENCOUNTER — Ambulatory Visit
Admission: RE | Admit: 2015-07-16 | Discharge: 2015-07-16 | Disposition: A | Discharge status: Home / Self Care | Payer: Medicare Other | Source: Ambulatory Visit

## 2015-07-16 DIAGNOSIS — Z1231 Encounter for screening mammogram for malignant neoplasm of breast: Secondary | ICD-10-CM

## 2015-07-18 DIAGNOSIS — H2513 Age-related nuclear cataract, bilateral: Secondary | ICD-10-CM | POA: Diagnosis not present

## 2016-03-17 ENCOUNTER — Other Ambulatory Visit: Payer: Self-pay | Admitting: Family Medicine

## 2016-04-02 ENCOUNTER — Other Ambulatory Visit (INDEPENDENT_AMBULATORY_CARE_PROVIDER_SITE_OTHER): Payer: Medicare HMO

## 2016-04-02 DIAGNOSIS — Z0000 Encounter for general adult medical examination without abnormal findings: Secondary | ICD-10-CM | POA: Diagnosis not present

## 2016-04-02 LAB — TSH: TSH: 1.79 u[IU]/mL (ref 0.35–4.50)

## 2016-04-02 LAB — CBC WITH DIFFERENTIAL/PLATELET
BASOS ABS: 0.1 10*3/uL (ref 0.0–0.1)
Basophils Relative: 1 % (ref 0.0–3.0)
EOS PCT: 2.7 % (ref 0.0–5.0)
Eosinophils Absolute: 0.2 10*3/uL (ref 0.0–0.7)
HEMATOCRIT: 41.9 % (ref 36.0–46.0)
Hemoglobin: 14.2 g/dL (ref 12.0–15.0)
LYMPHS ABS: 1.5 10*3/uL (ref 0.7–4.0)
LYMPHS PCT: 26.3 % (ref 12.0–46.0)
MCHC: 33.9 g/dL (ref 30.0–36.0)
MCV: 87.4 fl (ref 78.0–100.0)
MONOS PCT: 7.6 % (ref 3.0–12.0)
Monocytes Absolute: 0.4 10*3/uL (ref 0.1–1.0)
NEUTROS ABS: 3.4 10*3/uL (ref 1.4–7.7)
NEUTROS PCT: 62.4 % (ref 43.0–77.0)
PLATELETS: 285 10*3/uL (ref 150.0–400.0)
RBC: 4.79 Mil/uL (ref 3.87–5.11)
RDW: 13.5 % (ref 11.5–15.5)
WBC: 5.5 10*3/uL (ref 4.0–10.5)

## 2016-04-02 LAB — HEPATIC FUNCTION PANEL
ALBUMIN: 4.3 g/dL (ref 3.5–5.2)
ALK PHOS: 53 U/L (ref 39–117)
ALT: 19 U/L (ref 0–35)
AST: 22 U/L (ref 0–37)
Bilirubin, Direct: 0.1 mg/dL (ref 0.0–0.3)
TOTAL PROTEIN: 7.2 g/dL (ref 6.0–8.3)
Total Bilirubin: 0.7 mg/dL (ref 0.2–1.2)

## 2016-04-02 LAB — POC URINALSYSI DIPSTICK (AUTOMATED)
BILIRUBIN UA: NEGATIVE
GLUCOSE UA: NEGATIVE
Ketones, UA: NEGATIVE
Leukocytes, UA: NEGATIVE
NITRITE UA: NEGATIVE
Protein, UA: NEGATIVE
RBC UA: NEGATIVE
Spec Grav, UA: 1.015
Urobilinogen, UA: 0.2
pH, UA: 7.5

## 2016-04-02 LAB — BASIC METABOLIC PANEL
BUN: 20 mg/dL (ref 6–23)
CALCIUM: 9.8 mg/dL (ref 8.4–10.5)
CHLORIDE: 101 meq/L (ref 96–112)
CO2: 29 meq/L (ref 19–32)
Creatinine, Ser: 0.9 mg/dL (ref 0.40–1.20)
GFR: 66.41 mL/min (ref >60.00)
GLUCOSE: 104 mg/dL — AB (ref 70–99)
POTASSIUM: 3.6 meq/L (ref 3.5–5.1)
SODIUM: 138 meq/L (ref 135–145)

## 2016-04-02 LAB — LIPID PANEL
CHOLESTEROL: 277 mg/dL — AB (ref 0–200)
HDL: 82.4 mg/dL (ref >39.00)
LDL Cholesterol: 182 mg/dL — ABNORMAL HIGH (ref 0–99)
NONHDL: 194.29
Total CHOL/HDL Ratio: 3
Triglycerides: 63 mg/dL (ref 0.0–149.0)
VLDL: 12.6 mg/dL (ref 0.0–40.0)

## 2016-04-16 ENCOUNTER — Encounter: Payer: Self-pay | Admitting: Family Medicine

## 2016-04-16 ENCOUNTER — Ambulatory Visit (INDEPENDENT_AMBULATORY_CARE_PROVIDER_SITE_OTHER): Payer: Medicare HMO | Admitting: Family Medicine

## 2016-04-16 VITALS — BP 126/76 | HR 72 | Temp 98.2°F | Ht 62.75 in | Wt 157.0 lb

## 2016-04-16 DIAGNOSIS — Z0000 Encounter for general adult medical examination without abnormal findings: Secondary | ICD-10-CM

## 2016-04-16 MED ORDER — TETANUS-DIPHTH-ACELL PERTUSSIS 5-2.5-18.5 LF-MCG/0.5 IM SUSP: 0.5000 mL | Freq: Once | INTRAMUSCULAR | 0 refills | Status: AC

## 2016-04-16 MED ORDER — TRIAMCINOLONE ACETONIDE 0.5 % EX CREA: 1.0000 "application " | TOPICAL_CREAM | Freq: Two times a day (BID) | CUTANEOUS | 1 refills | Status: DC | PRN

## 2016-04-16 NOTE — Progress Notes (Signed)
Pre visit review using our clinic review tool, if applicable. No additional management support is needed unless otherwise documented below in the visit note. 

## 2016-04-16 NOTE — Patient Instructions (Addendum)
See if insurance covers tetanus shot at pharmacy- printed for you today  Also let us know when you get this and flu shot  Only concern is the sugar level which we discussed some ideas for today

## 2016-04-16 NOTE — Progress Notes (Signed)
Phone: [phone]  Subjective:  Patient presents today for their annual physical. Chief complaint-noted.   See problem oriented charting- ROS- full  review of systems was completed and negative including No chest pain or shortness of breath. No headache or blurry vision.   The following were reviewed and entered/updated in epic: Past Medical History:  Diagnosis Date  . Hyperlipidemia    Patient Active Problem List   Diagnosis Date Noted  . Essential hypertension 03/18/2015    Priority: Medium  . Hyperlipidemia 07/12/2008    Priority: Medium  . Osteopenia 01/04/2015    Priority: Low  . Rash 01/04/2015    Priority: Low   Past Surgical History:  Procedure Laterality Date  . APPENDECTOMY     along with other surgery  . 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 TOTAL ABDOMINAL HYSTERECTOMY W/ BILATERAL SALPINGOOPHORECTOMY     hysterectomy, still gets paps with obgyn    Family History  Problem Relation Age of Onset  . Stroke Mother 80  . Heart disease Father 60    sudden death while sleeping, no autopsy. prior smoker    Medications- reviewed and updated Current Outpatient Prescriptions  Medication Sig Dispense Refill  . aspirin 81 MG tablet Take 81 mg by mouth daily.      . B Complex-Biotin-FA (SUPER B-100) TABS Take 1 tablet by mouth daily.      . Calcium Carbonate-Vitamin D (CALCIUM-VITAMIN D) 600-125 MG-UNIT TABS Take 1 tablet by mouth 2 (two) times daily.    . Cholecalciferol (VITAMIN D3) 1000 UNITS tablet Take 1,000 Units by mouth daily.      . Flaxseed, Linseed, (FLAX SEED OIL) 1000 MG CAPS Take 1 capsule by mouth 2 (two) times daily.     . hydrochlorothiazide (HYDRODIURIL) 25 MG tablet TAKE 1/2 TABLET DAILY. 30 tablet 5  . Multiple Vitamin (MULTIVITAMIN) capsule Take 1 capsule by mouth daily.      . Omega-3 Fatty Acids (FISH OIL) 500 MG CAPS Take 1 capsule by mouth 2 (two) times daily.     Marland Kitchen triamcinolone cream (KENALOG) 0.5 % Apply 1 application  topically 2 (two) times daily as needed. rash 30 g 1   No current facility-administered medications for this visit.     Allergies-reviewed and updated No Known Allergies  Social History   Social History  . Marital status: Married    Spouse name: N/A  . Number of children: N/A  . Years of education: N/A   Social History Main Topics  . Smoking status: Never Smoker  . Smokeless tobacco: None  . Alcohol use 1.8 - 2.4 oz/week    3 - 4 Glasses of wine per week  . Drug use: No  . Sexual activity: Not Asked   Other Topics Concern  . None   Social History Narrative   Married (husband patient [name] of Dr. [facility]), no children, no pets.       Works as an Forensic psychologist- IT consultant      Hobbies: walking, working in yard    Objective: BP 126/76   Pulse 72   Temp 98.2 F (36.8 C) (Oral)   Ht 5' 2.75" (1.594 m)   Wt 157 lb (71.2 kg)   SpO2 95%   BMI 28.03 kg/m  Gen: NAD, resting comfortably HEENT: Mucous membranes are moist. Oropharynx normal Neck: no thyromegaly CV: RRR no murmurs rubs or gallops Lungs: CTAB no crackles, wheeze, rhonchi Abdomen: soft/nontender/nondistended/normal bowel sounds. No rebound or  guarding.  Ext: no edema Skin: warm, dry Neuro: grossly normal, moves all extremities, PERRLA  Breast and GYN deferred for gynecologist  Assessment/Plan:  65 y.o. female presenting for annual physical.  Health Maintenance counseling: 1. Anticipatory guidance: Patient counseled regarding regular dental exams, eye exams, wearing seatbelts.  2. Risk factor reduction:  Advised patient of need for regular exercise and diet rich and fruits and vegetables to reduce risk of heart attack and stroke.  3. Immunizations/screenings/ancillary studies Immunization History  Administered Date(s) Administered  . Influenza Split 06/06/2012  . Influenza,inj,Quad PF,36+ Mos 06/07/2013, 06/04/2014, 05/30/2015  . Pneumococcal Conjugate-13 06/04/2014  . Pneumococcal  Polysaccharide-23 05/30/2015  . Td 09/08/2003  . Zoster 02/03/2011   Health Maintenance Due  Topic Date Due  . TETANUS/TDAP - printed rx 09/07/2013  . INFLUENZA VACCINE - will get and let us know when 04/07/2016   4. Cervical cancer screening- passed age based screening 5. Breast cancer screening-  breast exam with GYn and mammogram 07/17/15 was norma- repeat this year 6. Colon cancer screening - 10/30/2009 with repeat 10 years  Status of chronic or acute concerns  HLD- on aspirin for primary prevention but not on statin. 10 year risk this year at 8.1% when 5.7 last year Lab Results  Component Value Date   CHOL 277 (H) 04/02/2016   HDL 82.40 04/02/2016   LDLCALC 182 (H) 04/02/2016   LDLDIRECT 160.5 06/01/2013   TRIG 63.0 04/02/2016   CHOLHDL 3 04/02/2016   HTN- contorlled on hctz 12.5in AM  in past, on repeat. At home getting below 120 BP Readings from Last 3 Encounters:  04/16/16 126/76  05/30/15 128/82  03/18/15 (!) 150/94   Osteopenia- taking calcium vitamin D (1200mg  calcium plus over 1000 Vit D)  Rash possible psoriasis- refilled triamcinolone- effective as needed  1 year CPE as long as checking BP at home  Meds ordered this encounter  Medications  . Calcium Carbonate-Vitamin D (CALCIUM-VITAMIN D) 600-125 MG-UNIT TABS    Sig: Take 1 tablet by mouth 2 (two) times daily.  Marland Kitchen triamcinolone cream (KENALOG) 0.5 %    Sig: Apply 1 application topically 2 (two) times daily as needed. rash    Dispense:  30 g    Refill:  1    Return precautions advised.   Garret Reddish, MD

## 2016-05-15 DIAGNOSIS — Z01419 Encounter for gynecological examination (general) (routine) without abnormal findings: Secondary | ICD-10-CM | POA: Diagnosis not present

## 2016-05-20 ENCOUNTER — Other Ambulatory Visit: Payer: Self-pay | Admitting: Family Medicine

## 2016-06-11 DIAGNOSIS — R69 Illness, unspecified: Secondary | ICD-10-CM | POA: Diagnosis not present

## 2016-08-21 DIAGNOSIS — M79672 Pain in left foot: Secondary | ICD-10-CM | POA: Diagnosis not present

## 2016-08-21 DIAGNOSIS — M84375A Stress fracture, left foot, initial encounter for fracture: Secondary | ICD-10-CM | POA: Diagnosis not present

## 2016-08-21 DIAGNOSIS — R600 Localized edema: Secondary | ICD-10-CM | POA: Diagnosis not present

## 2016-09-10 ENCOUNTER — Other Ambulatory Visit: Payer: Self-pay | Admitting: Family Medicine

## 2016-09-10 DIAGNOSIS — Z1231 Encounter for screening mammogram for malignant neoplasm of breast: Secondary | ICD-10-CM

## 2016-09-11 DIAGNOSIS — S92302G Fracture of unspecified metatarsal bone(s), left foot, subsequent encounter for fracture with delayed healing: Secondary | ICD-10-CM | POA: Diagnosis not present

## 2016-09-11 DIAGNOSIS — R600 Localized edema: Secondary | ICD-10-CM | POA: Diagnosis not present

## 2016-09-11 DIAGNOSIS — M79672 Pain in left foot: Secondary | ICD-10-CM | POA: Diagnosis not present

## 2016-09-14 ENCOUNTER — Ambulatory Visit
Admission: RE | Admit: 2016-09-14 | Discharge: 2016-09-14 | Disposition: A | Discharge status: Home / Self Care | Payer: Medicare HMO | Source: Ambulatory Visit | Attending: Family Medicine | Admitting: Family Medicine

## 2016-09-14 ENCOUNTER — Ambulatory Visit: Payer: Medicare HMO

## 2016-09-14 DIAGNOSIS — Z1231 Encounter for screening mammogram for malignant neoplasm of breast: Secondary | ICD-10-CM

## 2016-09-14 LAB — HM MAMMOGRAPHY

## 2016-09-30 DIAGNOSIS — R600 Localized edema: Secondary | ICD-10-CM | POA: Diagnosis not present

## 2016-09-30 DIAGNOSIS — S92302G Fracture of unspecified metatarsal bone(s), left foot, subsequent encounter for fracture with delayed healing: Secondary | ICD-10-CM | POA: Diagnosis not present

## 2016-09-30 DIAGNOSIS — M79672 Pain in left foot: Secondary | ICD-10-CM | POA: Diagnosis not present

## 2016-10-15 DIAGNOSIS — H5203 Hypermetropia, bilateral: Secondary | ICD-10-CM | POA: Diagnosis not present

## 2016-10-22 DIAGNOSIS — R600 Localized edema: Secondary | ICD-10-CM | POA: Diagnosis not present

## 2016-10-22 DIAGNOSIS — M79672 Pain in left foot: Secondary | ICD-10-CM | POA: Diagnosis not present

## 2016-10-22 DIAGNOSIS — S92302G Fracture of unspecified metatarsal bone(s), left foot, subsequent encounter for fracture with delayed healing: Secondary | ICD-10-CM | POA: Diagnosis not present

## 2016-11-19 DIAGNOSIS — S92302G Fracture of unspecified metatarsal bone(s), left foot, subsequent encounter for fracture with delayed healing: Secondary | ICD-10-CM | POA: Diagnosis not present

## 2016-11-19 DIAGNOSIS — M79672 Pain in left foot: Secondary | ICD-10-CM | POA: Diagnosis not present

## 2017-01-04 DIAGNOSIS — R600 Localized edema: Secondary | ICD-10-CM | POA: Diagnosis not present

## 2017-01-04 DIAGNOSIS — M79672 Pain in left foot: Secondary | ICD-10-CM | POA: Diagnosis not present

## 2017-01-04 DIAGNOSIS — S92302G Fracture of unspecified metatarsal bone(s), left foot, subsequent encounter for fracture with delayed healing: Secondary | ICD-10-CM | POA: Diagnosis not present

## 2017-04-22 ENCOUNTER — Encounter: Payer: Medicare HMO | Admitting: Family Medicine

## 2017-05-06 ENCOUNTER — Encounter: Payer: Self-pay | Admitting: Family Medicine

## 2017-05-06 ENCOUNTER — Ambulatory Visit (INDEPENDENT_AMBULATORY_CARE_PROVIDER_SITE_OTHER): Payer: Medicare HMO | Admitting: Family Medicine

## 2017-05-06 VITALS — BP 108/82 | HR 86 | Temp 98.3°F | Ht 65.0 in | Wt 158.2 lb

## 2017-05-06 DIAGNOSIS — Z0000 Encounter for general adult medical examination without abnormal findings: Secondary | ICD-10-CM | POA: Diagnosis not present

## 2017-05-06 DIAGNOSIS — E785 Hyperlipidemia, unspecified: Secondary | ICD-10-CM | POA: Diagnosis not present

## 2017-05-06 DIAGNOSIS — I10 Essential (primary) hypertension: Secondary | ICD-10-CM | POA: Diagnosis not present

## 2017-05-06 LAB — COMPREHENSIVE METABOLIC PANEL
ALBUMIN: 4.5 g/dL (ref 3.5–5.2)
ALT: 17 U/L (ref 0–35)
AST: 19 U/L (ref 0–37)
Alkaline Phosphatase: 55 U/L (ref 39–117)
BUN: 17 mg/dL (ref 6–23)
CALCIUM: 10 mg/dL (ref 8.4–10.5)
CHLORIDE: 101 meq/L (ref 96–112)
CO2: 31 mEq/L (ref 19–32)
Creatinine, Ser: 0.77 mg/dL (ref 0.40–1.20)
GFR: 79.25 mL/min (ref >60.00)
Glucose, Bld: 101 mg/dL — ABNORMAL HIGH (ref 70–99)
POTASSIUM: 4 meq/L (ref 3.5–5.1)
Sodium: 138 mEq/L (ref 135–145)
Total Bilirubin: 0.6 mg/dL (ref 0.2–1.2)
Total Protein: 6.9 g/dL (ref 6.0–8.3)

## 2017-05-06 LAB — LIPID PANEL
CHOLESTEROL: 305 mg/dL — AB (ref 0–200)
HDL: 80.1 mg/dL (ref >39.00)
LDL CALC: 213 mg/dL — AB (ref 0–99)
NonHDL: 224.81
TRIGLYCERIDES: 61 mg/dL (ref 0.0–149.0)
Total CHOL/HDL Ratio: 4
VLDL: 12.2 mg/dL (ref 0.0–40.0)

## 2017-05-06 LAB — CBC
HEMATOCRIT: 42.8 % (ref 36.0–46.0)
Hemoglobin: 14.3 g/dL (ref 12.0–15.0)
MCHC: 33.5 g/dL (ref 30.0–36.0)
MCV: 90.3 fl (ref 78.0–100.0)
PLATELETS: 305 10*3/uL (ref 150.0–400.0)
RBC: 4.74 Mil/uL (ref 3.87–5.11)
RDW: 13.5 % (ref 11.5–15.5)
WBC: 4.7 10*3/uL (ref 4.0–10.5)

## 2017-05-06 MED ORDER — HYDROCHLOROTHIAZIDE 25 MG PO TABS
12.5000 mg | ORAL_TABLET | Freq: Every day | ORAL | 3 refills | Status: DC
Start: 2017-05-06 — End: 2018-05-18

## 2017-05-06 MED ORDER — TRIAMCINOLONE ACETONIDE 0.5 % EX CREA: 1.0000 "application " | TOPICAL_CREAM | Freq: Two times a day (BID) | CUTANEOUS | 1 refills | Status: DC | PRN

## 2017-05-06 NOTE — Progress Notes (Addendum)
Phone: [phone]  Subjective:  Patient presents today for their annual physical. Chief complaint-noted.   See problem oriented charting- ROS- full  review of systems was completed and negative except for: occasional left knee pain  The following were reviewed and entered/updated in epic: Past Medical History:  Diagnosis Date  . Hyperlipidemia    Patient Active Problem List   Diagnosis Date Noted  . Essential hypertension 03/18/2015    Priority: Medium  . Hyperlipidemia 07/12/2008    Priority: Medium  . Osteopenia 01/04/2015    Priority: Low  . Rash 01/04/2015    Priority: Low   Past Surgical History:  Procedure Laterality Date  . APPENDECTOMY     along with other surgery  . 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 TOTAL ABDOMINAL HYSTERECTOMY W/ BILATERAL SALPINGOOPHORECTOMY     hysterectomy, still gets paps with obgyn    Family History  Problem Relation Age of Onset  . Stroke Mother 34  . Heart disease Father 69       sudden death while sleeping, no autopsy. prior smoker    Medications- reviewed and updated Current Outpatient Prescriptions  Medication Sig Dispense Refill  . aspirin 81 MG tablet Take 81 mg by mouth daily.      . B Complex-Biotin-FA (SUPER B-100) TABS Take 1 tablet by mouth daily.      . Calcium Carbonate-Vitamin D (CALCIUM-VITAMIN D) 600-125 MG-UNIT TABS Take 1 tablet by mouth 2 (two) times daily.    . Cholecalciferol (VITAMIN D3) 1000 UNITS tablet Take 1,000 Units by mouth daily.      . Flaxseed, Linseed, (FLAX SEED OIL) 1000 MG CAPS Take 1,400 mg by mouth 2 (two) times daily.     . hydrochlorothiazide (HYDRODIURIL) 25 MG tablet Take 0.5 tablets (12.5 mg total) by mouth daily. 46 tablet 3  . Multiple Vitamin (MULTIVITAMIN) capsule Take 1 capsule by mouth daily.      . Omega-3 Fatty Acids (FISH OIL) 500 MG CAPS Take 1,200 mg by mouth 2 (two) times daily.     Marland Kitchen triamcinolone cream (KENALOG) 0.5 % Apply 1 application topically 2  (two) times daily as needed. rash 30 g 1   No current facility-administered medications for this visit.     Allergies-reviewed and updated No Known Allergies  Social History   Social History  . Marital status: Married    Spouse name: N/A  . Number of children: N/A  . Years of education: N/A   Social History Main Topics  . Smoking status: Never Smoker  . Smokeless tobacco: Never Used  . Alcohol use 1.8 - 2.4 oz/week    3 - 4 Glasses of wine per week  . Drug use: No  . Sexual activity: Not Asked   Other Topics Concern  . None   Social History Narrative   Married (husband patient [name] of Dr[facility]), no children, no pets.       Works as an Forensic psychologist- IT consultant      Hobbies: walking, working in yard    Objective: BP 108/82 (BP Location: Left Arm, Patient Position: Sitting, Cuff Size: Large)   Pulse 86   Temp 98.3 F (36.8 C) (Oral)   Ht 5\' 5"  (1.651 m)   Wt 158 lb 3.2 oz (71.8 kg)   SpO2 96%   BMI 26.33 kg/m  Gen: NAD, resting comfortably HEENT: Mucous membranes are moist. Oropharynx normal Neck: no thyromegaly CV: RRR no murmurs rubs or gallops  Lungs: CTAB no crackles, wheeze, rhonchi Abdomen: soft/nontender/nondistended/normal bowel sounds. No rebound or guarding.  Ext: no edema Skin: warm, dry Neuro: grossly normal, moves all extremities, PERRLA  Assessment/Plan:  66 y.o. female presenting for annual physical.  Health Maintenance counseling: 1. Anticipatory guidance: Patient counseled regarding regular dental exams -q4 months, eye exams -yearly, wearing seatbelts.  2. Risk factor reduction:  Advised patient of need for regular exercise and diet rich and fruits and vegetables to reduce risk of heart attack and stroke. Exercise- walks regularly- slightly down due to heat. Diet-balanced/healthy. Could cut down on snacking. She wants to lose 5 lbs within a year.  Wt Readings from Last 3 Encounters:  05/06/17 158 lb 3.2 oz (71.8 kg)  04/16/16 157 lb  (71.2 kg)  05/30/15 155 lb (70.3 kg)  3. Immunizations/screenings/ancillary studies- flu shot later this fall. Had tetanus last year- will call us and let us know the date she got this.  Immunization History  Administered Date(s) Administered  . Influenza Split 06/06/2012  . Influenza,inj,Quad PF,6+ Mos 06/07/2013, 06/04/2014, 05/30/2015  . Pneumococcal Conjugate-13 06/04/2014  . Pneumococcal Polysaccharide-23 05/30/2015  . Td 09/08/2003  . Zoster 02/03/2011  4. Cervical cancer screening- passed age based screening, still sees gynecology 5. Breast cancer screening-  breast exam with GYN and mammogram 09/14/16.  6. Colon cancer screening - 10/30/2009 with 10 year repeat 7. Skin cancer screening- advised regular sunscreen use. Denies worrisome, changing, or new skin lesions. Sebaceous cyst on right side of neck  Status of chronic or acute concerns   Last year injured left knee and has some intermittent pain but much improved. Ligaments and meniscus on exam largely normal and no effusion. No pain today- will follow  Hypertension- controlled on hctz 12.5mg   Hyperlipidemia- need to update lipids. With last years #s 6.7% 10 year risk using Bp from today.   Rash behind and in ears- triamcinolone prn, refill today  Osteopenia- walks regularly- down a little bit due to the heat but plans to restart. On calcium, vitamin D- likely repeat dexa next CPE  1 year cpe. Could use her AWV as a 6 month BP check.   Orders Placed This Encounter  Procedures  . CBC    [place]  . Comprehensive metabolic panel    [place]    Order Specific Question:   Has the patient fasted?    Answer:   No  . Lipid panel    [place]    Order Specific Question:   Has the patient fasted?    Answer:   No    Meds ordered this encounter  Medications  . triamcinolone cream (KENALOG) 0.5 %    Sig: Apply 1 application topically 2 (two) times daily as needed. rash    Dispense:  30 g    Refill:  1  . hydrochlorothiazide  (HYDRODIURIL) 25 MG tablet    Sig: Take 0.5 tablets (12.5 mg total) by mouth daily.    Dispense:  46 tablet    Refill:  3    Return precautions advised.  Garret Reddish, MD

## 2017-05-06 NOTE — Patient Instructions (Addendum)
Had tetanus last year- will call us and let us know the date she got this.   I like your goal over the next year of trimming 5 lbs off

## 2017-05-07 ENCOUNTER — Other Ambulatory Visit: Payer: Self-pay

## 2017-05-07 MED ORDER — ATORVASTATIN CALCIUM 20 MG PO TABS: 20.0000 mg | ORAL_TABLET | ORAL | 3 refills | Status: DC

## 2017-05-27 DIAGNOSIS — M21962 Unspecified acquired deformity of left lower leg: Secondary | ICD-10-CM | POA: Diagnosis not present

## 2017-05-27 DIAGNOSIS — S92302G Fracture of unspecified metatarsal bone(s), left foot, subsequent encounter for fracture with delayed healing: Secondary | ICD-10-CM | POA: Diagnosis not present

## 2017-06-23 DIAGNOSIS — N952 Postmenopausal atrophic vaginitis: Secondary | ICD-10-CM | POA: Diagnosis not present

## 2017-06-23 DIAGNOSIS — Z01419 Encounter for gynecological examination (general) (routine) without abnormal findings: Secondary | ICD-10-CM | POA: Diagnosis not present

## 2017-06-25 ENCOUNTER — Encounter: Payer: Self-pay | Admitting: Family Medicine

## 2017-07-01 DIAGNOSIS — R69 Illness, unspecified: Secondary | ICD-10-CM | POA: Diagnosis not present

## 2017-08-29 DIAGNOSIS — S61201A Unspecified open wound of left index finger without damage to nail, initial encounter: Secondary | ICD-10-CM | POA: Diagnosis not present

## 2017-10-25 ENCOUNTER — Other Ambulatory Visit: Payer: Self-pay | Admitting: Family Medicine

## 2017-10-25 DIAGNOSIS — Z1231 Encounter for screening mammogram for malignant neoplasm of breast: Secondary | ICD-10-CM

## 2017-10-28 ENCOUNTER — Other Ambulatory Visit: Payer: Self-pay | Admitting: Family Medicine

## 2017-10-28 ENCOUNTER — Ambulatory Visit
Admission: RE | Admit: 2017-10-28 | Discharge: 2017-10-28 | Disposition: A | Discharge status: Home / Self Care | Payer: Medicare HMO | Source: Ambulatory Visit | Attending: Family Medicine | Admitting: Family Medicine

## 2017-10-28 DIAGNOSIS — Z1231 Encounter for screening mammogram for malignant neoplasm of breast: Secondary | ICD-10-CM

## 2017-11-04 ENCOUNTER — Ambulatory Visit: Payer: Medicare HMO | Admitting: *Deleted

## 2017-11-18 ENCOUNTER — Ambulatory Visit: Payer: Medicare HMO | Admitting: *Deleted

## 2017-11-24 DIAGNOSIS — H5203 Hypermetropia, bilateral: Secondary | ICD-10-CM | POA: Diagnosis not present

## 2017-11-26 ENCOUNTER — Telehealth: Payer: Self-pay

## 2017-11-26 NOTE — Telephone Encounter (Signed)
Call to Toni Mcgrath and LVM to call [facility] and reschedule her apt for her AWV next Thursday to a Thursday in April   Wynetta Fines RN

## 2017-12-01 ENCOUNTER — Ambulatory Visit: Payer: Medicare HMO | Admitting: *Deleted

## 2018-01-12 ENCOUNTER — Encounter: Payer: Self-pay | Admitting: *Deleted

## 2018-01-12 NOTE — Progress Notes (Signed)
Subjective:   Toni Mcgrath is a 67 y.o. female who presents for an Initial Medicare Annual Wellness Visit.    Reports health as good   Diet Lipids chol/hdl 4; chol 305 but hdl 80; ldl 213; trig 61  BMI 27.6 Try to watch for sodium   Exercise Was walking and broke a bone in her toe Walks 2 miles 35 minutes  There are no preventive care reminders to display for this patient. Will need a Tdap  Mammogram 10/2017 -  Dexa 01/09/2015; -1.8 GYN doctor   Colonoscopy 10/30/2009 and due 10/2019 Zoster 01/2011 Educated regarding the shingrix   Sleeps by 6 to 7 hours, working on the later   Cardiac Risk Factors include: advanced age (>75men, >3 women);family history of premature cardiovascular disease     Objective:    Today's Vitals   01/13/18 0758  BP: 110/80  Pulse: 80  SpO2: 98%  Weight: 161 lb (73 kg)  Height: 5\' 4"  (1.626 m)   Body mass index is 27.64 kg/m.  Advanced Directives 01/13/2018  Does Patient Have a Medical Advance Directive? No    Current Medications (verified) Outpatient Encounter Medications as of 01/13/2018  Medication Sig  . aspirin 81 MG tablet Take 81 mg by mouth daily.    Marland Kitchen atorvastatin (LIPITOR) 20 MG tablet Take 1 tablet (20 mg total) by mouth once a week.  . B Complex-Biotin-FA (SUPER B-100) TABS Take 1 tablet by mouth daily.    . Calcium Carbonate-Vitamin D (CALCIUM-VITAMIN D) 600-125 MG-UNIT TABS Take 1 tablet by mouth 2 (two) times daily.  . Cholecalciferol (VITAMIN D3) 1000 UNITS tablet Take 1,000 Units by mouth daily.    . Flaxseed, Linseed, (FLAX SEED OIL) 1000 MG CAPS Take 1,400 mg by mouth 2 (two) times daily.   . hydrochlorothiazide (HYDRODIURIL) 25 MG tablet Take 0.5 tablets (12.5 mg total) by mouth daily.  . Multiple Vitamin (MULTIVITAMIN) capsule Take 1 capsule by mouth daily.    . Omega-3 Fatty Acids (FISH OIL) 500 MG CAPS Take 1,200 mg by mouth 2 (two) times daily.   Marland Kitchen triamcinolone cream (KENALOG) 0.5 % Apply 1 application  topically 2 (two) times daily as needed. rash   No facility-administered encounter medications on file as of 01/13/2018.     Allergies (verified) Patient has no known allergies.   History: Past Medical History:  Diagnosis Date  . Hyperlipidemia    Past Surgical History:  Procedure Laterality Date  . APPENDECTOMY     along with other surgery  . 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 TOTAL ABDOMINAL HYSTERECTOMY W/ BILATERAL SALPINGOOPHORECTOMY     hysterectomy, still gets paps with obgyn   Family History  Problem Relation Age of Onset  . Stroke Mother 63  . Heart disease Father 57       sudden death while sleeping, no autopsy. prior smoker   Social History   Socioeconomic History  . Marital status: Married    Spouse name: Not on file  . Number of children: Not on file  . Years of education: Not on file  . Highest education level: Not on file  Occupational History  . Not on file  Social Needs  . Financial resource strain: Not on file  . Food insecurity:    Worry: Not on file    Inability: Not on file  . Transportation needs:    Medical: Not on file    Non-medical: Not on  file  Tobacco Use  . Smoking status: Never Smoker  . Smokeless tobacco: Never Used  Substance and Sexual Activity  . Alcohol use: Yes    Alcohol/week: 1.8 - 2.4 oz    Types: 3 - 4 Glasses of wine per week  . Drug use: No  . Sexual activity: Not on file  Lifestyle  . Physical activity:    Days per week: Not on file    Minutes per session: Not on file  . Stress: Not on file  Relationships  . Social connections:    Talks on phone: Not on file    Gets together: Not on file    Attends religious service: Not on file    Active member of club or organization: Not on file    Attends meetings of clubs or organizations: Not on file    Relationship status: Not on file  Other Topics Concern  . Not on file  Social History Narrative   Married (husband patient Toni Mcgrath of Dr. Yong Channel), no  children, no pets.       Works as an Forensic psychologist- IT consultant      Hobbies: walking, working in yard    [place] given: Yes   Clinical Intake:    Activities of Daily Living In your present state of health, do you have any difficulty performing the following activities: 01/13/2018  Hearing? N  Vision? N  Difficulty concentrating or making decisions? N  Comment still a practicing attorney  Walking or climbing stairs? N  Dressing or bathing? N  Doing errands, shopping? N  Preparing Food and eating ? N  Using the Toilet? N  In the past six months, have you accidently leaked urine? N  Do you have problems with loss of bowel control? N  Managing your Medications? N  Managing your Finances? N  Housekeeping or managing your Housekeeping? N  Some recent data might be hidden     Immunizations and Health Maintenance Immunization History  Administered Date(s) Administered  . Influenza Split 06/06/2012  . Influenza,inj,Quad PF,6+ Mos 06/07/2013, 06/04/2014, 05/30/2015  . Pneumococcal Conjugate-13 06/04/2014  . Pneumococcal Polysaccharide-23 05/30/2015  . Td 09/08/2003  . Tdap 06/11/2016  . Zoster 02/03/2011   There are no preventive care reminders to display for this patient.  Patient Care Team: Marin Olp, MD as PCP - General (Family Medicine)  GYN Dr. Philis Pique in [place] any recent Medical Services you may have received from other than [facility] providers in the past year (date may be approximate).     Assessment:   This is a routine wellness examination for Toni Mcgrath.  Hearing/Vision screen Hearing Screening Comments: Hearing issues  Not at this time Has eczema in left ear  Vision Screening Comments: Vision Annually  Just had it checked by Dr. [place] Dr. Quay Burow  Dietary issues and exercise activities discussed: Current Exercise Habits: Home exercise routine, Type of exercise: walking, Time (Minutes): 40, Frequency (Times/Week): 5,  Weekly Exercise (Minutes/Week): 200, Intensity: Moderate  Goals    . Exercise 150 min/wk Moderate Activity     Try to walk 2 miles 5 days a week Lose 5 to 10 lbs Will add a few weights        Depression Screen PHQ 2/9 Scores 01/13/2018 05/06/2017 04/16/2016 09/11/2014  PHQ - 2 Score 0 0 0 0    Fall Risk Fall Risk  01/13/2018 05/06/2017 04/16/2016 09/11/2014  Falls in the past year? No No No No  Cognitive Function: Ad8 score reviewed for issues:  Issues making decisions:  Less interest in hobbies / activities:  Repeats questions, stories (family complaining):  Trouble using ordinary gadgets (microwave, computer, phone):  Forgets the month or year:   Mismanaging finances:   Remembering appts:  Daily problems with thinking and/or memory: Ad8 score is=0     Screening Tests Health Maintenance  Topic Date Due  . Hepatitis C Screening  09/03/2098 (Originally [birth year])  . INFLUENZA VACCINE  04/07/2018  . MAMMOGRAM  10/29/2019  . COLONOSCOPY  10/31/2019  . TETANUS/TDAP  06/11/2026  . DEXA SCAN  Completed  . PNA vac Low Risk Adult  Completed         Plan:      PCP Notes   Health Maintenance Osteopenia; on calcium and Vit D Will start weight bearing exercise  Educated regarding shingrix   Ordered dexa prior to her annual in September   Abnormal Screens  none  Referrals  none  Patient concerns; Trying to add an hour to sleep   Nurse Concerns; As noted  Next PCP apt 05/18/2018      I have personally reviewed and noted the following in the patient's chart:   . Medical and social history . Use of alcohol, tobacco or illicit drugs  . Current medications and supplements . Functional ability and status . Nutritional status . Physical activity . Advanced directives . List of other physicians . Hospitalizations, surgeries, and ER visits in previous 12 months . Vitals . Screenings to include cognitive, depression, and falls . Referrals and  appointments  In addition, I have reviewed and discussed with patient certain preventive protocols, quality metrics, and best practice recommendations. A written personalized care plan for preventive services as well as general preventive health recommendations were provided to patient.     Wynetta Fines, RN   01/13/2018

## 2018-01-13 ENCOUNTER — Encounter: Payer: Self-pay | Admitting: *Deleted

## 2018-01-13 ENCOUNTER — Ambulatory Visit (INDEPENDENT_AMBULATORY_CARE_PROVIDER_SITE_OTHER): Payer: Medicare HMO | Admitting: *Deleted

## 2018-01-13 VITALS — BP 110/80 | HR 80 | Ht 64.0 in | Wt 161.0 lb

## 2018-01-13 DIAGNOSIS — E2839 Other primary ovarian failure: Secondary | ICD-10-CM

## 2018-01-13 DIAGNOSIS — Z0000 Encounter for general adult medical examination without abnormal findings: Secondary | ICD-10-CM | POA: Diagnosis not present

## 2018-01-13 NOTE — Progress Notes (Signed)
I have reviewed and agree with note, evaluation, plan.   Stephen Hunter, MD  

## 2018-01-13 NOTE — Patient Instructions (Addendum)
Toni Mcgrath , Thank you for taking time to come for your Medicare Wellness Visit. I appreciate your ongoing commitment to your health goals. Please review the following plan we discussed and let me know if I can assist you in the future.   Shingrix is a vaccine for the prevention of Shingles in Adults 50 and older.  If you are on Medicare, the shingrix is covered under your Part D plan, so you will take both of the vaccines in the series at your pharmacy. Please check with your benefits regarding applicable copays or out of pocket expenses.  The Shingrix is given in 2 vaccines approx 8 weeks apart. You must receive the 2nd dose prior to 6 months from receipt of the first. Please have the pharmacist print out you Immunization  dates for our office records   You can visit the [email or web address]   Will repeat your dexa at [facility] and they should call you for an apt. If they do not, let Manuela Schwartz know. You can reach me at [phone].  You can schedule prior to your fall apt with Dr. Yong Channel    These are the goals we discussed: Goals    . Exercise 150 min/wk Moderate Activity     Try to walk 2 miles 5 days a week Lose 5 to 10 lbs Will add a few weights         This is a list of the screening recommended for you and due dates:  Health Maintenance  Topic Date Due  .  Hepatitis C: One time screening is recommended by Center for Disease Control  (CDC) for  adults born from [birth year] through 1967.   09/03/2098*  . Flu Shot  04/07/2018  . Mammogram  10/29/2019  . Colon Cancer Screening  10/31/2019  . Tetanus Vaccine  06/11/2026  . DEXA scan (bone density measurement)  Completed  . Pneumonia vaccines  Completed  *Topic was postponed. The date shown is not the original due date.      Fall Prevention in the Home Falls can cause injuries. They can happen to people of all ages. There are many things you can do to make your home safe and to help prevent falls. What can I do on the outside of  my home?  Regularly fix the edges of walkways and driveways and fix any cracks.  Remove anything that might make you trip as you walk through a door, such as a raised step or threshold.  Trim any bushes or trees on the path to your home.  Use bright outdoor lighting.  Clear any walking paths of anything that might make someone trip, such as rocks or tools.  Regularly check to see if handrails are loose or broken. Make sure that both sides of any steps have handrails.  Any raised decks and porches should have guardrails on the edges.  Have any leaves, snow, or ice cleared regularly.  Use sand or salt on walking paths during winter.  Clean up any spills in your garage right away. This includes oil or grease spills. What can I do in the bathroom?  Use night lights.  Install grab bars by the toilet and in the tub and shower. Do not use towel bars as grab bars.  Use non-skid mats or decals in the tub or shower.  If you need to sit down in the shower, use a plastic, non-slip stool.  Keep the floor dry. Clean up any water that spills on the  floor as soon as it happens.  Remove soap buildup in the tub or shower regularly.  Attach bath mats securely with double-sided non-slip rug tape.  Do not have throw rugs and other things on the floor that can make you trip. What can I do in the bedroom?  Use night lights.  Make sure that you have a light by your bed that is easy to reach.  Do not use any sheets or blankets that are too big for your bed. They should not hang down onto the floor.  Have a firm chair that has side arms. You can use this for support while you get dressed.  Do not have throw rugs and other things on the floor that can make you trip. What can I do in the kitchen?  Clean up any spills right away.  Avoid walking on wet floors.  Keep items that you use a lot in easy-to-reach places.  If you need to reach something above you, use a strong step stool that has  a grab bar.  Keep electrical cords out of the way.  Do not use floor polish or wax that makes floors slippery. If you must use wax, use non-skid floor wax.  Do not have throw rugs and other things on the floor that can make you trip. What can I do with my stairs?  Do not leave any items on the stairs.  Make sure that there are handrails on both sides of the stairs and use them. Fix handrails that are broken or loose. Make sure that handrails are as long as the stairways.  Check any carpeting to make sure that it is firmly attached to the stairs. Fix any carpet that is loose or worn.  Avoid having throw rugs at the top or bottom of the stairs. If you do have throw rugs, attach them to the floor with carpet tape.  Make sure that you have a light switch at the top of the stairs and the bottom of the stairs. If you do not have them, ask someone to add them for you. What else can I do to help prevent falls?  Wear shoes that: ? Do not have high heels. ? Have rubber bottoms. ? Are comfortable and fit you well. ? Are closed at the toe. Do not wear sandals.  If you use a stepladder: ? Make sure that it is fully opened. Do not climb a closed stepladder. ? Make sure that both sides of the stepladder are locked into place. ? Ask someone to hold it for you, if possible.  Clearly mark and make sure that you can see: ? Any grab bars or handrails. ? First and last steps. ? Where the edge of each step is.  Use tools that help you move around (mobility aids) if they are needed. These include: ? Canes. ? Walkers. ? Scooters. ? Crutches.  Turn on the lights when you go into a dark area. Replace any light bulbs as soon as they burn out.  Set up your furniture so you have a clear path. Avoid moving your furniture around.  If any of your floors are uneven, fix them.  If there are any pets around you, be aware of where they are.  Review your medicines with your doctor. Some medicines can  make you feel dizzy. This can increase your chance of falling. Ask your doctor what other things that you can do to help prevent falls. This information is not intended to replace  advice given to you by your health care provider. Make sure you discuss any questions you have with your health care provider. Document Released: 06/20/2009 Document Revised: 01/30/2016 Document Reviewed: 09/28/2014 Elsevier Interactive Patient Education  2018 [place] Maintenance, Female Adopting a healthy lifestyle and getting preventive care can go a long way to promote health and wellness. Talk with your health care provider about what schedule of regular examinations is right for you. This is a good chance for you to check in with your provider about disease prevention and staying healthy. In between checkups, there are plenty of things you can do on your own. Experts have done a lot of research about which lifestyle changes and preventive measures are most likely to keep you healthy. Ask your health care provider for more information. Weight and diet Eat a healthy diet  Be sure to include plenty of vegetables, fruits, low-fat dairy products, and lean protein.  Do not eat a lot of foods high in solid fats, added sugars, or salt.  Get regular exercise. This is one of the most important things you can do for your health. ? Most adults should exercise for at least 150 minutes each week. The exercise should increase your heart rate and make you sweat (moderate-intensity exercise). ? Most adults should also do strengthening exercises at least twice a week. This is in addition to the moderate-intensity exercise.  Maintain a healthy weight  Body mass index (BMI) is a measurement that can be used to identify possible weight problems. It estimates body fat based on height and weight. Your health care provider can help determine your BMI and help you achieve or maintain a healthy weight.  For females 59  years of age and older: ? A BMI below 18.5 is considered underweight. ? A BMI of 18.5 to 24.9 is normal. ? A BMI of 25 to 29.9 is considered overweight. ? A BMI of 30 and above is considered obese.  Watch levels of cholesterol and blood lipids  You should start having your blood tested for lipids and cholesterol at 69 years of age, then have this test every 5 years.  You may need to have your cholesterol levels checked more often if: ? Your lipid or cholesterol levels are high. ? You are older than 69 years of age. ? You are at high risk for heart disease.  Cancer screening Lung Cancer  Lung cancer screening is recommended for adults 108-49 years old who are at high risk for lung cancer because of a history of smoking.  A yearly low-dose CT scan of the lungs is recommended for people who: ? Currently smoke. ? Have quit within the past 15 years. ? Have at least a 30-pack-year history of smoking. A pack year is smoking an average of one pack of cigarettes a day for 1 year.  Yearly screening should continue until it has been 15 years since you quit.  Yearly screening should stop if you develop a health problem that would prevent you from having lung cancer treatment.  Breast Cancer  Practice breast self-awareness. This means understanding how your breasts normally appear and feel.  It also means doing regular breast self-exams. Let your health care provider know about any changes, no matter how small.  If you are in your 20s or 30s, you should have a clinical breast exam (CBE) by a health care provider every 1-3 years as part of a regular health exam.  If you are 40 or older,  have a CBE every year. Also consider having a breast X-ray (mammogram) every year.  If you have a family history of breast cancer, talk to your health care provider about genetic screening.  If you are at high risk for breast cancer, talk to your health care provider about having an MRI and a mammogram every  year.  Breast cancer gene (BRCA) assessment is recommended for women who have family members with BRCA-related cancers. BRCA-related cancers include: ? Breast. ? Ovarian. ? Tubal. ? Peritoneal cancers.  Results of the assessment will determine the need for genetic counseling and BRCA1 and BRCA2 testing.  Cervical Cancer Your health care provider may recommend that you be screened regularly for cancer of the pelvic organs (ovaries, uterus, and vagina). This screening involves a pelvic examination, including checking for microscopic changes to the surface of your cervix (Pap test). You may be encouraged to have this screening done every 3 years, beginning at age 39.  For women ages 23-65, health care providers may recommend pelvic exams and Pap testing every 3 years, or they may recommend the Pap and pelvic exam, combined with testing for human papilloma virus (HPV), every 5 years. Some types of HPV increase your risk of cervical cancer. Testing for HPV may also be done on women of any age with unclear Pap test results.  Other health care providers may not recommend any screening for nonpregnant women who are considered low risk for pelvic cancer and who do not have symptoms. Ask your health care provider if a screening pelvic exam is right for you.  If you have had past treatment for cervical cancer or a condition that could lead to cancer, you need Pap tests and screening for cancer for at least 20 years after your treatment. If Pap tests have been discontinued, your risk factors (such as having a new sexual partner) need to be reassessed to determine if screening should resume. Some women have medical problems that increase the chance of getting cervical cancer. In these cases, your health care provider may recommend more frequent screening and Pap tests.  Colorectal Cancer  This type of cancer can be detected and often prevented.  Routine colorectal cancer screening usually begins at 69  years of age and continues through 69 years of age.  Your health care provider may recommend screening at an earlier age if you have risk factors for colon cancer.  Your health care provider may also recommend using home test kits to check for hidden blood in the stool.  A small camera at the end of a tube can be used to examine your colon directly (sigmoidoscopy or colonoscopy). This is done to check for the earliest forms of colorectal cancer.  Routine screening usually begins at age 78.  Direct examination of the colon should be repeated every 5-10 years through 69 years of age. However, you may need to be screened more often if early forms of precancerous polyps or small growths are found.  Skin Cancer  Check your skin from head to toe regularly.  Tell your health care provider about any new moles or changes in moles, especially if there is a change in a mole's shape or color.  Also tell your health care provider if you have a mole that is larger than the size of a pencil eraser.  Always use sunscreen. Apply sunscreen liberally and repeatedly throughout the day.  Protect yourself by wearing long sleeves, pants, a wide-brimmed hat, and sunglasses whenever you are outside.  Heart disease, diabetes, and high blood pressure  High blood pressure causes heart disease and increases the risk of stroke. High blood pressure is more likely to develop in: ? People who have blood pressure in the high end of the normal range (130-139/85-89 mm Hg). ? People who are overweight or obese. ? People who are African American.  If you are 19-70 years of age, have your blood pressure checked every 3-5 years. If you are 91 years of age or older, have your blood pressure checked every year. You should have your blood pressure measured twice-once when you are at a hospital or clinic, and once when you are not at a hospital or clinic. Record the average of the two measurements. To check your blood pressure  when you are not at a hospital or clinic, you can use: ? An automated blood pressure machine at a pharmacy. ? A home blood pressure monitor.  If you are between 27 years and 86 years old, ask your health care provider if you should take aspirin to prevent strokes.  Have regular diabetes screenings. This involves taking a blood sample to check your fasting blood sugar level. ? If you are at a normal weight and have a low risk for diabetes, have this test once every three years after 69 years of age. ? If you are overweight and have a high risk for diabetes, consider being tested at a younger age or more often. Preventing infection Hepatitis B  If you have a higher risk for hepatitis B, you should be screened for this virus. You are considered at high risk for hepatitis B if: ? You were born in a country where hepatitis B is common. Ask your health care provider which countries are considered high risk. ? Your parents were born in a high-risk country, and you have not been immunized against hepatitis B (hepatitis B vaccine). ? You have HIV or AIDS. ? You use needles to inject street drugs. ? You live with someone who has hepatitis B. ? You have had sex with someone who has hepatitis B. ? You get hemodialysis treatment. ? You take certain medicines for conditions, including cancer, organ transplantation, and autoimmune conditions.  Hepatitis C  Blood testing is recommended for: ? Everyone born from [birth year] through 1967. ? Anyone with known risk factors for hepatitis C.  Sexually transmitted infections (STIs)  You should be screened for sexually transmitted infections (STIs) including gonorrhea and chlamydia if: ? You are sexually active and are younger than 69 years of age. ? You are older than 69 years of age and your health care provider tells you that you are at risk for this type of infection. ? Your sexual activity has changed since you were last screened and you are at an increased  risk for chlamydia or gonorrhea. Ask your health care provider if you are at risk.  If you do not have HIV, but are at risk, it may be recommended that you take a prescription medicine daily to prevent HIV infection. This is called pre-exposure prophylaxis (PrEP). You are considered at risk if: ? You are sexually active and do not regularly use condoms or know the HIV status of your partner(s). ? You take drugs by injection. ? You are sexually active with a partner who has HIV.  Talk with your health care provider about whether you are at high risk of being infected with HIV. If you choose to begin PrEP, you should first be tested for HIV.  You should then be tested every 3 months for as long as you are taking PrEP. Pregnancy  If you are premenopausal and you may become pregnant, ask your health care provider about preconception counseling.  If you may become pregnant, take 400 to 800 micrograms (mcg) of folic acid every day.  If you want to prevent pregnancy, talk to your health care provider about birth control (contraception). Osteoporosis and menopause  Osteoporosis is a disease in which the bones lose minerals and strength with aging. This can result in serious bone fractures. Your risk for osteoporosis can be identified using a bone density scan.  If you are 28 years of age or older, or if you are at risk for osteoporosis and fractures, ask your health care provider if you should be screened.  Ask your health care provider whether you should take a calcium or vitamin D supplement to lower your risk for osteoporosis.  Menopause may have certain physical symptoms and risks.  Hormone replacement therapy may reduce some of these symptoms and risks. Talk to your health care provider about whether hormone replacement therapy is right for you. Follow these instructions at home:  Schedule regular health, dental, and eye exams.  Stay current with your immunizations.  Do not use any  tobacco products including cigarettes, chewing tobacco, or electronic cigarettes.  If you are pregnant, do not drink alcohol.  If you are breastfeeding, limit how much and how often you drink alcohol.  Limit alcohol intake to no more than 1 drink per day for nonpregnant women. One drink equals 12 ounces of beer, 5 ounces of wine, or 1 ounces of hard liquor.  Do not use street drugs.  Do not share needles.  Ask your health care provider for help if you need support or information about quitting drugs.  Tell your health care provider if you often feel depressed.  Tell your health care provider if you have ever been abused or do not feel safe at home. This information is not intended to replace advice given to you by your health care provider. Make sure you discuss any questions you have with your health care provider. Document Released: 03/09/2011 Document Revised: 01/30/2016 Document Reviewed: 05/28/2015 Elsevier Interactive Patient Education  [employer].

## 2018-04-16 ENCOUNTER — Other Ambulatory Visit: Payer: Self-pay | Admitting: Family Medicine

## 2018-04-20 DIAGNOSIS — D229 Melanocytic nevi, unspecified: Secondary | ICD-10-CM | POA: Diagnosis not present

## 2018-04-20 DIAGNOSIS — C44722 Squamous cell carcinoma of skin of right lower limb, including hip: Secondary | ICD-10-CM | POA: Diagnosis not present

## 2018-04-20 DIAGNOSIS — L814 Other melanin hyperpigmentation: Secondary | ICD-10-CM | POA: Diagnosis not present

## 2018-04-20 DIAGNOSIS — L821 Other seborrheic keratosis: Secondary | ICD-10-CM | POA: Diagnosis not present

## 2018-04-20 DIAGNOSIS — D485 Neoplasm of uncertain behavior of skin: Secondary | ICD-10-CM | POA: Diagnosis not present

## 2018-05-18 ENCOUNTER — Encounter: Payer: Self-pay | Admitting: Family Medicine

## 2018-05-18 ENCOUNTER — Ambulatory Visit (INDEPENDENT_AMBULATORY_CARE_PROVIDER_SITE_OTHER): Payer: Medicare HMO | Admitting: Family Medicine

## 2018-05-18 VITALS — BP 108/82 | HR 75 | Temp 98.3°F | Ht 64.0 in | Wt 161.0 lb

## 2018-05-18 DIAGNOSIS — Z0000 Encounter for general adult medical examination without abnormal findings: Secondary | ICD-10-CM | POA: Diagnosis not present

## 2018-05-18 DIAGNOSIS — M25562 Pain in left knee: Secondary | ICD-10-CM | POA: Diagnosis not present

## 2018-05-18 DIAGNOSIS — M85852 Other specified disorders of bone density and structure, left thigh: Secondary | ICD-10-CM

## 2018-05-18 DIAGNOSIS — G8929 Other chronic pain: Secondary | ICD-10-CM | POA: Diagnosis not present

## 2018-05-18 DIAGNOSIS — E785 Hyperlipidemia, unspecified: Secondary | ICD-10-CM

## 2018-05-18 DIAGNOSIS — I10 Essential (primary) hypertension: Secondary | ICD-10-CM

## 2018-05-18 LAB — COMPREHENSIVE METABOLIC PANEL
ALBUMIN: 4.5 g/dL (ref 3.5–5.2)
ALT: 24 U/L (ref 0–35)
AST: 24 U/L (ref 0–37)
Alkaline Phosphatase: 57 U/L (ref 39–117)
BILIRUBIN TOTAL: 0.5 mg/dL (ref 0.2–1.2)
BUN: 16 mg/dL (ref 6–23)
CALCIUM: 10.7 mg/dL — AB (ref 8.4–10.5)
CHLORIDE: 101 meq/L (ref 96–112)
CO2: 30 meq/L (ref 19–32)
Creatinine, Ser: 0.75 mg/dL (ref 0.40–1.20)
GFR: 81.44 mL/min (ref >60.00)
Glucose, Bld: 104 mg/dL — ABNORMAL HIGH (ref 70–99)
Potassium: 3.7 mEq/L (ref 3.5–5.1)
Sodium: 138 mEq/L (ref 135–145)
Total Protein: 7.4 g/dL (ref 6.0–8.3)

## 2018-05-18 LAB — LIPID PANEL
CHOLESTEROL: 231 mg/dL — AB (ref 0–200)
HDL: 91 mg/dL (ref >39.00)
LDL Cholesterol: 129 mg/dL — ABNORMAL HIGH (ref 0–99)
NONHDL: 140.04
TRIGLYCERIDES: 55 mg/dL (ref 0.0–149.0)
Total CHOL/HDL Ratio: 3
VLDL: 11 mg/dL (ref 0.0–40.0)

## 2018-05-18 LAB — CBC
HCT: 43.5 % (ref 36.0–46.0)
HEMOGLOBIN: 14.7 g/dL (ref 12.0–15.0)
MCHC: 33.8 g/dL (ref 30.0–36.0)
MCV: 87.9 fl (ref 78.0–100.0)
PLATELETS: 293 10*3/uL (ref 150.0–400.0)
RBC: 4.95 Mil/uL (ref 3.87–5.11)
RDW: 14 % (ref 11.5–15.5)
WBC: 5.2 10*3/uL (ref 4.0–10.5)

## 2018-05-18 MED ORDER — HYDROCHLOROTHIAZIDE 25 MG PO TABS: 12.5000 mg | ORAL_TABLET | Freq: Every day | ORAL | 3 refills | Status: DC

## 2018-05-18 MED ORDER — TRIAMCINOLONE ACETONIDE 0.5 % EX CREA: 1.0000 "application " | TOPICAL_CREAM | Freq: Two times a day (BID) | CUTANEOUS | 1 refills | Status: DC | PRN

## 2018-05-18 MED ORDER — ATORVASTATIN CALCIUM 20 MG PO TABS: ORAL_TABLET | ORAL | 3 refills | Status: DC

## 2018-05-18 NOTE — Patient Instructions (Addendum)
Health Maintenance Due  Topic Date Due  . INFLUENZA VACCINE -let us know when you receive from [facility] 04/07/2018   Schedule your bone density test at check out desk. You may also call directly to X-ray at [phone] to schedule an appointment that is convenient for you.  - located [address] across the street from [place] - in the basement - you do need an appointment for the bone density tests.   Please stop by lab before you go

## 2018-05-18 NOTE — Progress Notes (Signed)
Phone: [phone]  Subjective:  Patient presents today for their annual physical. Chief complaint-noted.   See problem oriented charting- ROS- full  review of systems was completed and negative except for: left knee pain  The following were reviewed and entered/updated in epic: Past Medical History:  Diagnosis Date  . Hyperlipidemia    Patient Active Problem List   Diagnosis Date Noted  . Essential hypertension 03/18/2015    Priority: Medium  . Hyperlipidemia 07/12/2008    Priority: Medium  . Osteopenia 01/04/2015    Priority: Low  . Rash 01/04/2015    Priority: Low   Past Surgical History:  Procedure Laterality Date  . APPENDECTOMY     along with other surgery  . 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 TOTAL ABDOMINAL HYSTERECTOMY W/ BILATERAL SALPINGOOPHORECTOMY     hysterectomy, still gets paps with obgyn    Family History  Problem Relation Age of Onset  . Stroke Mother 9  . Heart disease Father 45       sudden death while sleeping, no autopsy. prior smoker    Medications- reviewed and updated Current Outpatient Medications  Medication Sig Dispense Refill  . aspirin 81 MG tablet Take 81 mg by mouth daily.      Marland Kitchen atorvastatin (LIPITOR) 20 MG tablet TAKE 1 TABLET(20 MG) BY MOUTH 1 TIME A WEEK 14 tablet 0  . B Complex-Biotin-FA (SUPER B-100) TABS Take 1 tablet by mouth daily.      . Calcium Carbonate-Vitamin D (CALCIUM-VITAMIN D) 600-125 MG-UNIT TABS Take 1 tablet by mouth 2 (two) times daily.    . Cholecalciferol (VITAMIN D3) 1000 UNITS tablet Take 1,000 Units by mouth daily.      . Flaxseed, Linseed, (FLAX SEED OIL) 1000 MG CAPS Take 1,400 mg by mouth 2 (two) times daily.     . hydrochlorothiazide (HYDRODIURIL) 25 MG tablet Take 0.5 tablets (12.5 mg total) by mouth daily. 46 tablet 3  . Multiple Vitamin (MULTIVITAMIN) capsule Take 1 capsule by mouth daily.      . Omega-3 Fatty Acids (FISH OIL) 500 MG CAPS Take 1,200 mg by mouth 2 (two) times  daily.     Marland Kitchen triamcinolone cream (KENALOG) 0.5 % Apply 1 application topically 2 (two) times daily as needed. rash 30 g 1   No current facility-administered medications for this visit.     Allergies-reviewed and updated No Known Allergies  Social History   Social History Narrative   Married (husband patient [name] of Dr. Yong Channel), no children, no pets.       Works as an Forensic psychologist- IT consultant      Hobbies: walking, working in yard    Objective: BP 108/82 (BP Location: Left Arm, Patient Position: Sitting, Cuff Size: Large)   Pulse 75   Temp 98.3 F (36.8 C) (Oral)   Ht 5\' 4"  (1.626 m)   Wt 161 lb (73 kg)   SpO2 97%   BMI 27.64 kg/m  Gen: NAD, resting comfortably HEENT: Mucous membranes are moist. Oropharynx normal Neck: no thyromegaly CV: RRR no murmurs rubs or gallops Lungs: CTAB no crackles, wheeze, rhonchi Abdomen: soft/nontender/nondistended/normal bowel sounds. No rebound or guarding.  Ext: no edema Skin: warm, dry Neuro: grossly normal, moves all extremities, PERRLA  Left Knee: Normal to inspection with no erythema or effusion or obvious bony abnormalities. Palpation normal with no warmth,, patellar tenderness, or condyle tenderness. Does have some medial joint line tenderness ROM full in flexion and extension  and lower leg rotation. Ligaments with solid consistent endpoints including ACL, PCL, LCL, MCL. Negative Mcmurray's  Assessment/Plan:  67 y.o. female presenting for annual physical.  Health Maintenance counseling: 1. Anticipatory guidance: Patient counseled regarding regular dental exams she goes q4 months, eye exams - yearly, wearing seatbelts.  2. Risk factor reduction:  Advised patient of need for regular exercise and diet rich and fruits and vegetables to reduce risk of heart attack and stroke. Exercise- out of this recently- had been walking more regularly- is going to restart soon- goal 150 minutes a week. Diet- Her goal from weight 158 last  year was 5 lbs weight loss. She is up 3 lbs- we discussed improving dietary intake - states has been over snacking on unhealthy things- knows needs to cut back Wt Readings from Last 3 Encounters:  05/18/18 161 lb (73 kg)  01/13/18 161 lb (73 kg)  05/06/17 158 lb 3.2 oz (71.8 kg)  3. Immunizations/screenings/ancillary studies- flu shot with [facility] Immunization History  Administered Date(s) Administered  . Influenza Split 06/06/2012  . Influenza, High Dose Seasonal PF 07/01/2017  . Influenza,inj,Quad PF,6+ Mos 06/07/2013, 06/04/2014, 05/30/2015  . Pneumococcal Conjugate-13 06/04/2014  . Pneumococcal Polysaccharide-23 05/30/2015  . Td 09/08/2003  . Tdap 06/11/2016  . Zoster 02/03/2011  4. Cervical cancer screening- passed age based screening but still follows with GYN for pelvic exams 5. Breast cancer screening-  breast exam with GYN and mammogram 10/28/17 6. Colon cancer screening - 10/30/09 with 10 year repeat 7. Skin cancer screening- going to skin surgery center about to have a squamous cell removed behind right calf- likely sebaceous cyst on right side of neck- derm also looked at several other areas and said benigns cysts- advised regular sunscreen use. Denies worrisome, changing, or new skin lesions.  8. Birth control/STD check- postmenopausal/monogomous  9. Osteoporosis screening at 11- see osteopenia below 10. Never smoker  Status of chronic or acute concerns   Hypertension- controlled on hctz 12.5mg   Hyperlipidemia- started atorvastatin 20mg  once weekly - feels some calve tightness. Update lipids. LDL was above 190 but she wanted to work on lifestyle to see if could improve #s in addition to medication instead of higher dose statin.   Left knee pain starting about 2 years ago after she twisted her ankle- was better last year but then worsened since then- tends to be more first thing in the morning- gets better with activity- worse with sitting for a while - she doesn't  necessarily want to take anything for it but just wants to have information as to whats causing it. Has some medial joint line pain on my exam but otherwise unremarkable. We will refer to Dr. Paulla Fore for his expert opinion. For conservative care we discussed biofreeze or icy hot as options. Can also try turmeric 500mg  daily in case arthritis/inflammatory  Rash behind and in ears- triamcinolone prn, refill today  Osteopenia- walks regularly-  On calcium, vitamin D-osteopenia in left femoral neck- will update DEXA with last in 2016 and worst t score of -1.8  1 year cpe. awv planned next may   No problem-specific Assessment & Plan notes found for this encounter.   Future Appointments  Date Time Provider [place]  01/19/2019  8:00 AM [facility]   No follow-ups on file.  Lab/Order associations: coffee with creamer this AM  Preventative health care  Essential hypertension  Hyperlipidemia, unspecified hyperlipidemia type  Osteopenia of neck of left femur  No orders of the defined  types were placed in this encounter.   Return precautions advised.  Garret Reddish, MD

## 2018-05-19 ENCOUNTER — Encounter: Payer: Self-pay | Admitting: Family Medicine

## 2018-05-19 DIAGNOSIS — C44722 Squamous cell carcinoma of skin of right lower limb, including hip: Secondary | ICD-10-CM | POA: Diagnosis not present

## 2018-05-25 ENCOUNTER — Ambulatory Visit (INDEPENDENT_AMBULATORY_CARE_PROVIDER_SITE_OTHER)
Admission: RE | Admit: 2018-05-25 | Discharge: 2018-05-25 | Disposition: A | Discharge status: Home / Self Care | Payer: Medicare HMO | Source: Ambulatory Visit | Attending: Family Medicine | Admitting: Family Medicine

## 2018-05-25 DIAGNOSIS — M85852 Other specified disorders of bone density and structure, left thigh: Secondary | ICD-10-CM | POA: Diagnosis not present

## 2018-05-25 DIAGNOSIS — Z0000 Encounter for general adult medical examination without abnormal findings: Secondary | ICD-10-CM

## 2018-05-26 ENCOUNTER — Ambulatory Visit: Payer: Medicare HMO | Admitting: Sports Medicine

## 2018-05-30 ENCOUNTER — Encounter: Payer: Self-pay | Admitting: Family Medicine

## 2018-05-30 DIAGNOSIS — Z85828 Personal history of other malignant neoplasm of skin: Secondary | ICD-10-CM | POA: Insufficient documentation

## 2018-06-01 ENCOUNTER — Encounter: Payer: Self-pay | Admitting: Sports Medicine

## 2018-06-01 ENCOUNTER — Ambulatory Visit (INDEPENDENT_AMBULATORY_CARE_PROVIDER_SITE_OTHER): Payer: Medicare HMO

## 2018-06-01 ENCOUNTER — Ambulatory Visit (INDEPENDENT_AMBULATORY_CARE_PROVIDER_SITE_OTHER): Payer: Medicare HMO | Admitting: Sports Medicine

## 2018-06-01 VITALS — BP 130/86 | HR 80 | Ht 64.0 in | Wt 164.2 lb

## 2018-06-01 DIAGNOSIS — G8929 Other chronic pain: Secondary | ICD-10-CM | POA: Diagnosis not present

## 2018-06-01 DIAGNOSIS — M25562 Pain in left knee: Secondary | ICD-10-CM

## 2018-06-01 DIAGNOSIS — R29898 Other symptoms and signs involving the musculoskeletal system: Secondary | ICD-10-CM | POA: Diagnosis not present

## 2018-06-01 DIAGNOSIS — M1712 Unilateral primary osteoarthritis, left knee: Secondary | ICD-10-CM

## 2018-06-01 MED ORDER — DICLOFENAC SODIUM 1 % TD GEL: TRANSDERMAL | 1 refills | Status: AC

## 2018-06-01 NOTE — Progress Notes (Signed)
Toni Mcgrath. Toni Mcgrath, [place] [facility] [place]  Toni Mcgrath - 67 y.o. female MRN [id number]  Date of birth: Jan 31, 1951  Visit Date: 06/01/2018  PCP: Marin Olp, MD   Referred by: Marin Olp, MD   Scribe(s) for today's visit: Wendy Poet, LAT, ATC  SUBJECTIVE:  [name] is here for New Patient (Initial Visit) (L knee pain)  Referred by: Dr. Yong Channel  HPI: Her L knee pain symptoms INITIALLY: Began about 2 years ago after twisting her L knee when she rolled her L ankle stepping down off her steps in her garage.  She reports having more L medial knee pain. Described as mild soreness, nonradiating Worsened with standing after prolonged sitting or after getting up in the morning Improved with Biofreeze but temporarily Additional associated symptoms include: no L knee swelling, no N/T noted in L LE and no L knee mechanical symptoms    At this time symptoms are worsening compared to onset. She has been using Biofreeze.  REVIEW OF SYSTEMS: Reports night time disturbances. Denies fevers, chills, or night sweats. Denies unexplained weight loss. Denies personal history of cancer. Denies changes in bowel or bladder habits. Denies recent unreported falls. Denies new or worsening dyspnea or wheezing. Denies headaches or dizziness.  Denies numbness, tingling or weakness  In the extremities.  Denies dizziness or presyncopal episodes Denies lower extremity edema    HISTORY:  Prior history reviewed and updated per electronic medical record.  Social History   Occupational History  . Not on file  Tobacco Use  . Smoking status: Never Smoker  . Smokeless tobacco: Never Used  Substance and Sexual Activity  . Alcohol use: Yes    Alcohol/week: 3.0 - 4.0 standard drinks    Types: 3 - 4 Glasses of wine per week  . Drug use: No  . Sexual activity: Not on file   Social History   Social History Narrative   Married (husband patient Toni Mcgrath of Dr. Yong Channel), no children, no pets.       Works as an Forensic psychologist- IT consultant      Hobbies: walking, working in yard    Past Medical History:  Diagnosis Date  . Hyperlipidemia    Past Surgical History:  Procedure Laterality Date  . APPENDECTOMY     along with other surgery  . 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 TOTAL ABDOMINAL HYSTERECTOMY W/ BILATERAL SALPINGOOPHORECTOMY     hysterectomy, still gets paps with obgyn   family history includes Heart disease (age of onset: 49) in her father; Stroke (age of onset: 80) in her mother.  DATA OBTAINED & REVIEWED:  No results for input(s): HGBA1C, LABURIC, CREATINE in the last 8760 hours. . 06/01/2018: X-ray left knee: Tricompartmental degenerative changes.  OBJECTIVE:  VS:  HT:5\' 4"  (162.6 cm)   WT:164 lb 3.2 oz (74.5 kg)  BMI:28.17    BP:130/86  HR:80bpm  TEMP: ( )  RESP:96 %   PHYSICAL EXAM: CONSTITUTIONAL: Well-developed, Well-nourished and In no acute distress PSYCHIATRIC: Alert & appropriately interactive. and Not depressed or anxious appearing. RESPIRATORY: No increased work of breathing and Trachea Midline EYES: Pupils are equal., EOM intact without nystagmus. and No scleral icterus.  VASCULAR EXAM: Warm and well perfused NEURO: unremarkable  MSK Exam: Left knee  Well aligned, no significant deformity. No overlying skin changes. Mild TTP directly over the posterior medial knee but this  is mild. No focal bony tenderness   RANGE OF MOTION & STRENGTH  Full flexion extension of the knee.   SPECIALITY TESTING:  Mild TTP over the medial lateral joint lines.  Negative McMurray  Hip abduction weakness with side-lying.     ASSESSMENT   1. Chronic pain of left knee   2. Weakness of right hip   3. Primary osteoarthritis of left knee     PLAN:  Pertinent additional documentation may be included in corresponding procedure notes, imaging studies, problem based  documentation and patient instructions.  Procedures:  . Discussed the foundation of treatment for this condition is physical therapy and/or daily (5-6 days/week) therapeutic exercises, focusing on core strengthening, coordination, neuromuscular control/reeducation.  Therapeutic exercises prescribed per procedure note.  Medications:  Meds ordered this encounter  Medications  . diclofenac sodium (VOLTAREN) 1 % GEL    Sig: Apply topically to affected area qid    Dispense:  100 g    Refill:  1   Discussion/Instructions: No problem-specific Assessment & Plan notes found for this encounter.  Marland Kitchen Ultimately functional knee pain secondary to hip weakness. . Topical anti-inflammatories to be used as needed. . Discussed red flag symptoms that warrant earlier emergent evaluation and patient voices understanding. . Activity modifications and the importance of avoiding exacerbating activities (limiting pain to no more than a 4 / 10 during or following activity) recommended and discussed.  Follow-up:  . Return if symptoms worsen or fail to improve.   . If any lack of improvement consider: further diagnostic evaluation with MRI  . At follow up will plan to consider: Injections     CMA/ATC served as scribe during this visit. History, Physical, and Plan performed by medical provider. Documentation and orders reviewed and attested to.      Gerda Diss, [place] [facility] Physician

## 2018-06-01 NOTE — Patient Instructions (Addendum)
Please perform the exercise program that we have prepared for you and gone over in detail on a daily basis.  In addition to the handout you were provided you can access your program through: [email or web address]   Your unique program code is:   [email or web address]

## 2018-06-01 NOTE — Progress Notes (Signed)

## 2018-06-20 ENCOUNTER — Encounter: Payer: Self-pay | Admitting: Sports Medicine

## 2018-06-22 ENCOUNTER — Ambulatory Visit (INDEPENDENT_AMBULATORY_CARE_PROVIDER_SITE_OTHER): Payer: Medicare HMO | Admitting: Family Medicine

## 2018-06-22 ENCOUNTER — Telehealth: Payer: Self-pay | Admitting: Sports Medicine

## 2018-06-22 ENCOUNTER — Encounter: Payer: Self-pay | Admitting: Family Medicine

## 2018-06-22 VITALS — BP 138/80 | HR 67 | Temp 98.3°F | Ht 64.0 in | Wt 164.6 lb

## 2018-06-22 DIAGNOSIS — M85852 Other specified disorders of bone density and structure, left thigh: Secondary | ICD-10-CM | POA: Diagnosis not present

## 2018-06-22 DIAGNOSIS — Z23 Encounter for immunization: Secondary | ICD-10-CM

## 2018-06-22 DIAGNOSIS — I10 Essential (primary) hypertension: Secondary | ICD-10-CM

## 2018-06-22 NOTE — Telephone Encounter (Signed)
Called pt and left VM to call the office.  

## 2018-06-22 NOTE — Assessment & Plan Note (Signed)
S: controlled on hydrochlorothiazide 12.5 mg though high normal BP Readings from Last 3 Encounters:  06/22/18 138/80  06/01/18 130/86  05/18/18 108/82  A/P: We discussed blood pressure goal of <140/90. Continue current meds: Gave her another copy of the Dash eating plan

## 2018-06-22 NOTE — Telephone Encounter (Signed)
Patient stated 2 weeks ago Dr. Paulla Fore prescribed rx for a "salve" for her knee. Patient stated her pharmacy said they have been faxing [place] needing more information and she has not heard anything yet. Please advise.

## 2018-06-22 NOTE — Progress Notes (Signed)
Subjective:  Toni Mcgrath is a 67 y.o. year old very pleasant female patient who presents for/with See problem oriented charting ROS-continued issues with knee pain.  No edema noted.  No chest pain or shortness of breath reported.  Past Medical History-  Patient Active Problem List   Diagnosis Date Noted  . Essential hypertension 03/18/2015    Priority: Medium  . Hyperlipidemia 07/12/2008    Priority: Medium  . Osteopenia 01/04/2015    Priority: Low  . Rash 01/04/2015    Priority: Low  . History of skin cancer 05/30/2018    Medications- reviewed and updated Current Outpatient Medications  Medication Sig Dispense Refill  . aspirin 81 MG tablet Take 81 mg by mouth daily.      Marland Kitchen atorvastatin (LIPITOR) 20 MG tablet TAKE 1 TABLET(20 MG) BY MOUTH 1 TIME A WEEK 14 tablet 3  . B Complex-Biotin-FA (SUPER B-100) TABS Take 1 tablet by mouth daily.      . Calcium Carbonate-Vitamin D (CALCIUM-VITAMIN D) 600-125 MG-UNIT TABS Take 1 tablet by mouth 2 (two) times daily.    . Cholecalciferol (VITAMIN D3) 1000 UNITS tablet Take 1,000 Units by mouth daily.      . diclofenac sodium (VOLTAREN) 1 % GEL Apply topically to affected area qid 100 g 1  . Flaxseed, Linseed, (FLAX SEED OIL) 1000 MG CAPS Take 1,400 mg by mouth 2 (two) times daily.     . hydrochlorothiazide (HYDRODIURIL) 25 MG tablet Take 0.5 tablets (12.5 mg total) by mouth daily. 46 tablet 3  . Multiple Vitamin (MULTIVITAMIN) capsule Take 1 capsule by mouth daily.      . Omega-3 Fatty Acids (FISH OIL) 500 MG CAPS Take 1,200 mg by mouth 2 (two) times daily.     Marland Kitchen triamcinolone cream (KENALOG) 0.5 % Apply 1 application topically 2 (two) times daily as needed. rash 30 g 1    Objective: BP 138/80 (BP Location: Left Arm, Patient Position: Sitting, Cuff Size: Large)   Pulse 67   Temp 98.3 F (36.8 C) (Oral)   Ht 5\' 4"  (1.626 m)   Wt 164 lb 9.6 oz (74.7 kg)   SpO2 97%   BMI 28.25 kg/m  Gen: NAD, resting comfortably, very pleasant CV:  RRR  Lungs: nonlabored, normal respiratory rate Abdomen: soft/nondistended Ext: no edema Skin: warm, dry Neuro: Speech normal, moves all extremities  Assessment/Plan:  Osteopenia S: patient with history of osteopenia.  She reports history of early menopause which likely contributes to condition.   DEXA  01/2015 with T score ap spine -1.3, left femur -1.3, right femur -1.8 05/25/18 with T score lumbar spine -1.5, left femur -1.6, right femur -2.1  She takes calcium and vitamin D already.  She had been doing weightbearing exercise with walking until the last 6 months before bone density due to her knee issue. A/P: We had extended discussion today.  Her hip fracture risk was elevated at 3.6% over 10 years and she had did have worsening from 2 to 5% of her bone density.  We considered starting Fosamax.  Given her decrease in weightbearing exercise we opted to restart this first and continue calcium at 1200 mg/day (discussed perhaps 600 through oral tablet and 600 to diet) as well as at least 800 units of vitamin D per day.  We will recheck bone density in 2 years and depending on rate of worsening may start Fosamax at that time.  If she is unable to increase her exercise she will call and  we will go ahead and start Fosamax.   Essential hypertension S: controlled on hydrochlorothiazide 12.5 mg though high normal BP Readings from Last 3 Encounters:  06/22/18 138/80  06/01/18 130/86  05/18/18 108/82  A/P: We discussed blood pressure goal of <140/90. Continue current meds: Gave her another copy of the Dash eating plan    Future Appointments  Date Time Provider [place]  01/19/2019  8:00 AM [facility]  05/24/2019  8:20 AM Marin Olp, MD [facility]   Lab/Order associations: Need for prophylactic vaccination and inoculation against influenza - Plan: Flu vaccine HIGH DOSE PF  Osteopenia of neck of left femur  Essential hypertension  Return precautions  advised.  Garret Reddish, MD

## 2018-06-22 NOTE — Assessment & Plan Note (Signed)
S: patient with history of osteopenia.  She reports history of early menopause which likely contributes to condition.   DEXA  01/2015 with T score ap spine -1.3, left femur -1.3, right femur -1.8 05/25/18 with T score lumbar spine -1.5, left femur -1.6, right femur -2.1  She takes calcium and vitamin D already.  She had been doing weightbearing exercise with walking until the last 6 months before bone density due to her knee issue. A/P: We had extended discussion today.  Her hip fracture risk was elevated at 3.6% over 10 years and she had did have worsening from 2 to 5% of her bone density.  We considered starting Fosamax.  Given her decrease in weightbearing exercise we opted to restart this first and continue calcium at 1200 mg/day (discussed perhaps 600 through oral tablet and 600 to diet) as well as at least 800 units of vitamin D per day.  We will recheck bone density in 2 years and depending on rate of worsening may start Fosamax at that time.  If she is unable to increase her exercise she will call and we will go ahead and start Fosamax.

## 2018-06-22 NOTE — Patient Instructions (Addendum)
Lets hold off on fosamax for now. As long as you can get at least 800 units vitamin d and 1200mg  calcium per day along with 150 minutes of weight bearing exercise per week- as long as your knee cooperates  We will recheck bone density in 2 years.    DASH Eating Plan DASH stands for "Dietary Approaches to Stop Hypertension." The DASH eating plan is a healthy eating plan that has been shown to reduce high blood pressure (hypertension). It may also reduce your risk for type 2 diabetes, heart disease, and stroke. The DASH eating plan may also help with weight loss. What are tips for following this plan? General guidelines  Avoid eating more than 2,300 mg (milligrams) of salt (sodium) a day. If you have hypertension, you may need to reduce your sodium intake to 1,500 mg a day.  Limit alcohol intake to no more than 1 drink a day for nonpregnant women and 2 drinks a day for men. One drink equals 12 oz of beer, 5 oz of wine, or 1 oz of hard liquor.  Work with your health care provider to maintain a healthy body weight or to lose weight. Ask what an ideal weight is for you.  Get at least 30 minutes of exercise that causes your heart to beat faster (aerobic exercise) most days of the week. Activities may include walking, swimming, or biking.  Work with your health care provider or diet and nutrition specialist (dietitian) to adjust your eating plan to your individual calorie needs. Reading food labels  Check food labels for the amount of sodium per serving. Choose foods with less than 5 percent of the Daily Value of sodium. Generally, foods with less than 300 mg of sodium per serving fit into this eating plan.  To find whole grains, look for the word "whole" as the first word in the ingredient list. Shopping  Buy products labeled as "low-sodium" or "no salt added."  Buy fresh foods. Avoid canned foods and premade or frozen meals. Cooking  Avoid adding salt when cooking. Use salt-free seasonings  or herbs instead of table salt or sea salt. Check with your health care provider or pharmacist before using salt substitutes.  Do not fry foods. Cook foods using healthy methods such as baking, boiling, grilling, and broiling instead.  Cook with heart-healthy oils, such as olive, canola, soybean, or sunflower oil. Meal planning   Eat a balanced diet that includes: ? 5 or more servings of fruits and vegetables each day. At each meal, try to fill half of your plate with fruits and vegetables. ? Up to 6-8 servings of whole grains each day. ? Less than 6 oz of lean meat, poultry, or fish each day. A 3-oz serving of meat is about the same size as a deck of cards. One egg equals 1 oz. ? 2 servings of low-fat dairy each day. ? A serving of nuts, seeds, or beans 5 times each week. ? Heart-healthy fats. Healthy fats called Omega-3 fatty acids are found in foods such as flaxseeds and coldwater fish, like sardines, salmon, and mackerel.  Limit how much you eat of the following: ? Canned or prepackaged foods. ? Food that is high in trans fat, such as fried foods. ? Food that is high in saturated fat, such as fatty meat. ? Sweets, desserts, sugary drinks, and other foods with added sugar. ? Full-fat dairy products.  Do not salt foods before eating.  Try to eat at least 2 vegetarian meals each  week.  Eat more home-cooked food and less restaurant, buffet, and fast food.  When eating at a restaurant, ask that your food be prepared with less salt or no salt, if possible. What foods are recommended? The items listed may not be a complete list. Talk with your dietitian about what dietary choices are best for you. Grains Whole-grain or whole-wheat bread. Whole-grain or whole-wheat pasta. Brown rice. [place]. Bulgur. Whole-grain and low-sodium cereals. Pita bread. Low-fat, low-sodium crackers. Whole-wheat flour tortillas. Vegetables Fresh or frozen vegetables (raw, steamed, roasted, or  grilled). Low-sodium or reduced-sodium tomato and vegetable juice. Low-sodium or reduced-sodium tomato sauce and tomato paste. Low-sodium or reduced-sodium canned vegetables. Fruits All fresh, dried, or frozen fruit. Canned fruit in natural juice (without added sugar). Meat and other protein foods Skinless chicken or [place]. Ground chicken or [place]. Pork with fat trimmed off. Fish and seafood. Egg whites. Dried beans, peas, or lentils. Unsalted nuts, nut butters, and seeds. Unsalted canned beans. Lean cuts of beef with fat trimmed off. Low-sodium, lean deli meat. Dairy Low-fat (1%) or fat-free (skim) milk. Fat-free, low-fat, or reduced-fat cheeses. Nonfat, low-sodium ricotta or cottage cheese. Low-fat or nonfat yogurt. Low-fat, low-sodium cheese. Fats and oils Soft margarine without trans fats. Vegetable oil. Low-fat, reduced-fat, or light mayonnaise and salad dressings (reduced-sodium). Canola, safflower, olive, soybean, and sunflower oils. Avocado. Seasoning and other foods Herbs. Spices. Seasoning mixes without salt. Unsalted popcorn and pretzels. Fat-free sweets. What foods are not recommended? The items listed may not be a complete list. Talk with your dietitian about what dietary choices are best for you. Grains Baked goods made with fat, such as croissants, muffins, or some breads. Dry pasta or rice meal packs. Vegetables Creamed or fried vegetables. Vegetables in a cheese sauce. Regular canned vegetables (not low-sodium or reduced-sodium). Regular canned tomato sauce and paste (not low-sodium or reduced-sodium). Regular tomato and vegetable juice (not low-sodium or reduced-sodium). [name]. Olives. Fruits Canned fruit in a light or heavy syrup. Fried fruit. Fruit in cream or butter sauce. Meat and other protein foods Fatty cuts of meat. Ribs. Fried meat. [name]. Sausage. Bologna and other processed lunch meats. Salami. Fatback. Hotdogs. Bratwurst. Salted nuts and seeds. Canned beans with  added salt. Canned or smoked fish. Whole eggs or egg yolks. Chicken or [place] with skin. Dairy Whole or 2% milk, cream, and half-and-half. Whole or full-fat cream cheese. Whole-fat or sweetened yogurt. Full-fat cheese. Nondairy creamers. Whipped toppings. Processed cheese and cheese spreads. Fats and oils Butter. Stick margarine. Lard. Shortening. Ghee. Bacon fat. Tropical oils, such as coconut, palm kernel, or palm oil. Seasoning and other foods Salted popcorn and pretzels. Onion salt, garlic salt, seasoned salt, table salt, and sea salt. Worcestershire sauce. Tartar sauce. Barbecue sauce. Teriyaki sauce. Soy sauce, including reduced-sodium. Steak sauce. Canned and packaged gravies. Fish sauce. Oyster sauce. Cocktail sauce. Horseradish that you find on the shelf. Ketchup. Mustard. Meat flavorings and tenderizers. Bouillon cubes. Hot sauce and Tabasco sauce. Premade or packaged marinades. Premade or packaged taco seasonings. Relishes. Regular salad dressings. Where to find more information:  National Heart, Lung, and [place]: https://wilson-eaton.com/  American Heart Association: www.heart.org Summary  The DASH eating plan is a healthy eating plan that has been shown to reduce high blood pressure (hypertension). It may also reduce your risk for type 2 diabetes, heart disease, and stroke.  With the DASH eating plan, you should limit salt (sodium) intake to 2,300 mg a day. If you have hypertension, you may need to reduce your  sodium intake to 1,500 mg a day.  When on the DASH eating plan, aim to eat more fresh fruits and vegetables, whole grains, lean proteins, low-fat dairy, and heart-healthy fats.  Work with your health care provider or diet and nutrition specialist (dietitian) to adjust your eating plan to your individual calorie needs. This information is not intended to replace advice given to you by your health care provider. Make sure you discuss any questions you have with your health care  provider. Document Released: 08/13/2011 Document Revised: 08/17/2016 Document Reviewed: 08/17/2016 Elsevier Interactive Patient Education  [employer].

## 2018-06-22 NOTE — Telephone Encounter (Addendum)
Rx for Voltaren gel was denied by insurance. "For osteoarthritis, you must have tried and failed, have intolerance to, or medical reason you cannot use two formulary generic oral nonsteroidal anti-inflammatory drugs (NSAIDs). Some examples are Meloxicam, Nabumetone, and Flurbiprofen.

## 2018-06-23 NOTE — Telephone Encounter (Signed)
Called pt and left VM to call the office.  

## 2018-06-27 NOTE — Telephone Encounter (Signed)
Unable to reach pt by phone, letter mailed.

## 2018-06-30 ENCOUNTER — Ambulatory Visit: Payer: Medicare HMO

## 2018-07-15 ENCOUNTER — Other Ambulatory Visit: Payer: Self-pay | Admitting: Family Medicine

## 2018-08-10 DIAGNOSIS — Z01419 Encounter for gynecological examination (general) (routine) without abnormal findings: Secondary | ICD-10-CM | POA: Diagnosis not present

## 2018-09-14 ENCOUNTER — Other Ambulatory Visit: Payer: Self-pay | Admitting: Family Medicine

## 2018-09-14 DIAGNOSIS — Z1231 Encounter for screening mammogram for malignant neoplasm of breast: Secondary | ICD-10-CM

## 2018-10-31 ENCOUNTER — Ambulatory Visit
Admission: RE | Admit: 2018-10-31 | Discharge: 2018-10-31 | Disposition: A | Discharge status: Home / Self Care | Payer: Medicare HMO | Source: Ambulatory Visit | Attending: Family Medicine | Admitting: Family Medicine

## 2018-10-31 DIAGNOSIS — Z1231 Encounter for screening mammogram for malignant neoplasm of breast: Secondary | ICD-10-CM | POA: Diagnosis not present

## 2019-01-19 ENCOUNTER — Ambulatory Visit (INDEPENDENT_AMBULATORY_CARE_PROVIDER_SITE_OTHER): Payer: Medicare HMO | Admitting: Family Medicine

## 2019-01-19 ENCOUNTER — Ambulatory Visit: Payer: Medicare HMO

## 2019-01-19 ENCOUNTER — Encounter: Payer: Self-pay | Admitting: Family Medicine

## 2019-01-19 VITALS — Ht 64.0 in | Wt 165.0 lb

## 2019-01-19 DIAGNOSIS — E785 Hyperlipidemia, unspecified: Secondary | ICD-10-CM

## 2019-01-19 DIAGNOSIS — Z0000 Encounter for general adult medical examination without abnormal findings: Secondary | ICD-10-CM

## 2019-01-19 DIAGNOSIS — E663 Overweight: Secondary | ICD-10-CM | POA: Diagnosis not present

## 2019-01-19 DIAGNOSIS — M85852 Other specified disorders of bone density and structure, left thigh: Secondary | ICD-10-CM | POA: Diagnosis not present

## 2019-01-19 NOTE — Patient Instructions (Addendum)
  Ms. Mavity , Thank you for taking time to come for your Medicare Wellness Visit. I appreciate your ongoing commitment to your health goals. Please review the following plan we discussed and let me know if I can assist you in the future.   These are the goals we discussed: 1. Walk 10 miles a week consistently 2. Try to get weight back under 160 like it has been from 2016-2018! You can do this!    This is a list of the screening recommended for you and due dates:  Health Maintenance  Topic Date Due  .  Hepatitis C: One time screening is recommended by Center for Disease Control  (CDC) for  adults born from [birth year] through 1967.   09/03/2098*  . Flu Shot  04/08/2019  . Colon Cancer Screening  10/31/2019  . Mammogram  10/31/2020  . Tetanus Vaccine  06/11/2026  . DEXA scan (bone density measurement)  Completed  . Pneumonia vaccines  Completed  *Topic was postponed. The date shown is not the original due date.

## 2019-01-19 NOTE — Progress Notes (Addendum)
Phone [phone]   Subjective:  Virtual visit via Video note. Chief complaint: Chief Complaint  Patient presents with  . Medicare Wellness  Patient presents today for their medicare annual wellness visit.    This visit type was conducted due to national recommendations for restrictions regarding the COVID-19 Pandemic (e.g. social distancing).  This format is felt to be most appropriate for this patient at this time balancing risks to patient and risks to population by having him in for in person visit.  No physical exam was performed (except for noted visual exam or audio findings with Telehealth visits).    Our team/I connected with Toni Mcgrath at  8:00 AM EDT by a video enabled telemedicine application (doxy.me or caregility through epic) and verified that I am speaking with the correct person using two identifiers.  Location patient: Home-[place] Location provider: [facility], office Persons participating in the virtual visit:  patient  Our team/I discussed the limitations of evaluation and management by telemedicine and the availability of in person appointments. In light of current covid-19 pandemic, patient also understands that we are trying to protect them by minimizing in office contact if at all possible.  The patient expressed consent for telemedicine visit and agreed to proceed. Patient understands insurance will be billed.   Preventive Screening-Counseling & Management  Smoking Status: Never Smoker Second Hand Smoking status: No smokers in home  Risk Factors Regular exercise:  Bad during winter- trying to do 10 miles walking a week Diet: noted overweight- trying to eat a healthy balanced diet but snacking more than she should. Trying to watch salt intake  Fall Risk: None  Fall Risk  01/19/2019 01/13/2018 05/06/2017 04/16/2016 09/11/2014  Falls in the past year? 0 No No No No  Opioid use history: no long term opioids use  Cardiac risk factors:  advanced age ( 18 for women)   Hyperlipidemia - improved control on atorvastatin 20mg  once a week- still could push for further reductions- will work on this through lifestyle changes  Lab Results  Component Value Date   CHOL 231 (H) 05/18/2018   HDL 91.00 05/18/2018   LDLCALC 129 (H) 05/18/2018   LDLDIRECT 160.5 06/01/2013   TRIG 55.0 05/18/2018   CHOLHDL 3 05/18/2018  No diabetes.  Family History:  Dad with MI at 71- had been former smoker, mom with stroke in 35s. Due to history- she has opted to take aspirin Hypertension: yes but controlled  Depression Screen None. PHQ2 0  Depression screen [facility] 2/9 01/19/2019 01/13/2018 05/06/2017 04/16/2016 09/11/2014  Decreased Interest 0 0 0 0 0  Down, Depressed, Hopeless 0 0 0 0 0  PHQ - 2 Score 0 0 0 0 0    Activities of Daily Living Independent ADLs and IADLs   Hearing Difficulties: -patient declines  Cognitive Testing             No reported trouble.   Normal 3 word recall with minicog and normal clock draw  List the Names of Other Physician/Practitioners you currently use: -Dr. Philis Pique with GYN -[place] [facility] - sees local dentist  Immunization History  Administered Date(s) Administered  . Influenza Split 06/06/2012  . Influenza, High Dose Seasonal PF 07/01/2017, 06/22/2018  . Influenza,inj,Quad PF,6+ Mos 06/07/2013, 06/04/2014, 05/30/2015  . Pneumococcal Conjugate-13 06/04/2014  . Pneumococcal Polysaccharide-23 05/30/2015  . Td 09/08/2003  . Tdap 06/11/2016  . Zoster 02/03/2011   Required Immunizations needed today -none required.  Would be a candidate for Shingrix- we opted out of  this at present given COVID-19 pandemic.  Screening tests- up to date -Passed age today screening for cervical cancer but continues to follow with GYN for pelvic exams - Breast exam with GYN-last mammogram was 10/31/2018 -Colon cancer screening in February 2011 with 10-year repeat  - annual dermatology appointment in august  ROS- No pertinent positives discovered in course of  AWV ROS pertinent- No chest pain or shortness of breath. No headache or blurry vision.   The following were reviewed and entered/updated in epic: Past Medical History:  Diagnosis Date  . Hyperlipidemia    Patient Active Problem List   Diagnosis Date Noted  . Essential hypertension 03/18/2015    Priority: Medium  . Hyperlipidemia 07/12/2008    Priority: Medium  . Osteopenia 01/04/2015    Priority: Low  . Rash 01/04/2015    Priority: Low  . History of skin cancer 05/30/2018   Past Surgical History:  Procedure Laterality Date  . APPENDECTOMY     along with other surgery  . 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 TOTAL ABDOMINAL HYSTERECTOMY W/ BILATERAL SALPINGOOPHORECTOMY     hysterectomy, still gets paps with obgyn    Family History  Problem Relation Age of Onset  . Stroke Mother 19  . Heart disease Father 42       sudden death while sleeping, no autopsy. prior smoker    Medications- reviewed and updated Current Outpatient Medications  Medication Sig Dispense Refill  . aspirin 81 MG tablet Take 81 mg by mouth daily.      Marland Kitchen atorvastatin (LIPITOR) 20 MG tablet TAKE 1 TABLET(20 MG) BY MOUTH 1 TIME A WEEK 14 tablet 3  . B Complex-Biotin-FA (SUPER B-100) TABS Take 1 tablet by mouth daily.      . Calcium Carbonate-Vitamin D (CALCIUM-VITAMIN D) 600-125 MG-UNIT TABS Take 1 tablet by mouth daily.     . Cholecalciferol (VITAMIN D3) 1000 UNITS tablet Take 1,000 Units by mouth daily.      . diclofenac sodium (VOLTAREN) 1 % GEL Apply topically to affected area qid 100 g 1  . Flaxseed, Linseed, (FLAX SEED OIL) 1000 MG CAPS Take 1,400 mg by mouth 2 (two) times daily.     . hydrochlorothiazide (HYDRODIURIL) 25 MG tablet Take 0.5 tablets (12.5 mg total) by mouth daily. 46 tablet 3  . Multiple Vitamin (MULTIVITAMIN) capsule Take 1 capsule by mouth daily.      . Omega-3 Fatty Acids (FISH OIL) 500 MG CAPS Take 1,200 mg by mouth 2 (two) times daily.     Marland Kitchen triamcinolone cream  (KENALOG) 0.5 % Apply 1 application topically 2 (two) times daily as needed. rash 30 g 1   No current facility-administered medications for this visit.     Allergies-reviewed and updated No Known Allergies  Social History   Socioeconomic History  . Marital status: Married    Spouse name: Not on file  . Number of children: Not on file  . Years of education: Not on file  . Highest education level: Not on file  Occupational History  . Not on file  Social Needs  . Financial resource strain: Not on file  . Food insecurity:    Worry: Not on file    Inability: Not on file  . Transportation needs:    Medical: Not on file    Non-medical: Not on file  Tobacco Use  . Smoking status: Never Smoker  . Smokeless tobacco: Never Used  Substance and  Sexual Activity  . Alcohol use: Yes    Alcohol/week: 3.0 - 4.0 standard drinks    Types: 3 - 4 Glasses of wine per week  . Drug use: No  . Sexual activity: Not on file  Lifestyle  . Physical activity:    Days per week: Not on file    Minutes per session: Not on file  . Stress: Not on file  Relationships  . Social connections:    Talks on phone: Not on file    Gets together: Not on file    Attends religious service: Not on file    Active member of club or organization: Not on file    Attends meetings of clubs or organizations: Not on file    Relationship status: Not on file  Other Topics Concern  . Not on file  Social History Narrative   Married (husband patient [name] of [employer]), no children, no pets.       Works as an Forensic psychologist- IT consultant      Hobbies: walking, working in yard      Objective:  Ht 5\' 4"  (1.626 m)   Wt 165 lb (74.8 kg)   BMI 28.32 kg/m   Patient is going to call back with self reported home blood pressure and HR (she was unable to locate at visit) Gen: NAD, resting comfortably Lungs: nonlabored, normal respiratory rate  Skin: appears dry, no obvious rash   Assessment/Plan:  AWV completed-  discussed recommended screenings and documented any personalized health advice and referrals for preventive counseling. See AVS as well which was given to patient.   Status of chronic or acute concerns   #hypertension S: controlled on  hctz 12.5 mg on last check BP Readings from Last 3 Encounters:  06/22/18 138/80  06/01/18 130/86  05/18/18 108/82  A/P: she has to find her home cuff but will update [place] with #s within a week  # Osteopenia S: Worst T score in September 2019 was -2.1 at right femur.  She takes calcium and vitamin D daily.  She knows the importance of weightbearing exercise.  Hip fracture risk was elevated at 3.6% on last FRAX score.   A/P: previously- We considered Fosamax in the past but ultimately decided to focus on weightbearing exercise and continue calcium and vitamin D.  Plan is to recheck in September 2021. Continue same plan for now  # has some seasonal allergies- she chooses to tolerate these. - occasional tylenol/allergy medicine  # knee OA- has not had to use voltaren gel much   Labs to be updated in september  Future Appointments  Date Time Provider [place]  05/24/2019  8:20 AM Marin Olp, MD [facility]   Lab/Order associations: Preventative health care  Hyperlipidemia, unspecified hyperlipidemia type  Osteopenia of neck of left femur  Overweight  Return precautions advised. Garret Reddish, MD

## 2019-01-19 NOTE — Progress Notes (Deleted)
Phone [phone]   Subjective:  Virtual visit via Video note. Chief complaint: Chief Complaint  Patient presents with  . Medicare Wellness    This visit type was conducted due to national recommendations for restrictions regarding the COVID-19 Pandemic (e.g. social distancing).  This format is felt to be most appropriate for this patient at this time balancing risks to patient and risks to population by having him in for in person visit.  No physical exam was performed (except for noted visual exam or audio findings with Telehealth visits).    Our team/I connected with Toni Mcgrath at  8:00 AM EDT by a video enabled telemedicine application (doxy.me or caregility through epic) and verified that I am speaking with the correct person using two identifiers.  Location patient: Home[place] Location provider: [facility], office Persons participating in the virtual visit:  patient  Our team/I discussed the limitations of evaluation and management by telemedicine and the availability of in person appointments. In light of current covid-19 pandemic, patient also understands that we are trying to protect them by minimizing in office contact if at all possible.  The patient expressed consent for telemedicine visit and agreed to proceed. Patient understands insurance will be billed.   ROS- ***   Past Medical History-  Patient Active Problem List   Diagnosis Date Noted  . History of skin cancer 05/30/2018  . Essential hypertension 03/18/2015  . Osteopenia 01/04/2015  . Rash 01/04/2015  . Hyperlipidemia 07/12/2008    Medications- reviewed and updated Current Outpatient Medications  Medication Sig Dispense Refill  . aspirin 81 MG tablet Take 81 mg by mouth daily.      Marland Kitchen atorvastatin (LIPITOR) 20 MG tablet TAKE 1 TABLET(20 MG) BY MOUTH 1 TIME A WEEK 14 tablet 3  . B Complex-Biotin-FA (SUPER B-100) TABS Take 1 tablet by mouth daily.      . Calcium Carbonate-Vitamin D (CALCIUM-VITAMIN D)  600-125 MG-UNIT TABS Take 1 tablet by mouth 2 (two) times daily.    . Cholecalciferol (VITAMIN D3) 1000 UNITS tablet Take 1,000 Units by mouth daily.      . diclofenac sodium (VOLTAREN) 1 % GEL Apply topically to affected area qid 100 g 1  . Flaxseed, Linseed, (FLAX SEED OIL) 1000 MG CAPS Take 1,400 mg by mouth 2 (two) times daily.     . hydrochlorothiazide (HYDRODIURIL) 25 MG tablet Take 0.5 tablets (12.5 mg total) by mouth daily. 46 tablet 3  . Multiple Vitamin (MULTIVITAMIN) capsule Take 1 capsule by mouth daily.      . Omega-3 Fatty Acids (FISH OIL) 500 MG CAPS Take 1,200 mg by mouth 2 (two) times daily.     Marland Kitchen triamcinolone cream (KENALOG) 0.5 % Apply 1 application topically 2 (two) times daily as needed. rash 30 g 1   No current facility-administered medications for this visit.      Objective:  Ht 5\' 4"  (1.626 m)   Wt 165 lb (74.8 kg)   BMI 28.32 kg/m  self reported vitals Gen: NAD, resting comfortably Lungs: nonlabored, normal respiratory rate *** Skin: appears dry, no obvious rash     Assessment and Plan   # *** S:***  A/P: ***   Future Appointments  Date Time Provider [place]  05/24/2019  8:20 AM Marin Olp, MD [facility]   No follow-ups on file.  Lab/Order associations: No diagnosis found.  No orders of the defined types were placed in this encounter.   Return precautions advised.   L  , CMA

## 2019-04-28 DIAGNOSIS — L218 Other seborrheic dermatitis: Secondary | ICD-10-CM | POA: Diagnosis not present

## 2019-04-28 DIAGNOSIS — L821 Other seborrheic keratosis: Secondary | ICD-10-CM | POA: Diagnosis not present

## 2019-04-28 DIAGNOSIS — L814 Other melanin hyperpigmentation: Secondary | ICD-10-CM | POA: Diagnosis not present

## 2019-04-28 DIAGNOSIS — D1801 Hemangioma of skin and subcutaneous tissue: Secondary | ICD-10-CM | POA: Diagnosis not present

## 2019-04-28 DIAGNOSIS — D229 Melanocytic nevi, unspecified: Secondary | ICD-10-CM | POA: Diagnosis not present

## 2019-04-28 DIAGNOSIS — Z85828 Personal history of other malignant neoplasm of skin: Secondary | ICD-10-CM | POA: Diagnosis not present

## 2019-04-28 DIAGNOSIS — L738 Other specified follicular disorders: Secondary | ICD-10-CM | POA: Diagnosis not present

## 2019-05-24 ENCOUNTER — Encounter: Payer: Medicare HMO | Admitting: Family Medicine

## 2019-06-12 NOTE — Patient Instructions (Addendum)
Health Maintenance Due  Topic Date Due  . INFLUENZA VACCINE -today 04/08/2019   Please check with your pharmacy to see if they have the shingrix vaccine. If they do- please get this immunization and update [place] by phone call or mychart with dates you receive the vaccine. It is a 2 shot series  No changes today  Please stop by lab before you go If you do not have mychart- we will call you about results within 5 business days of [place] receiving them.  If you have mychart- we will send your results within 3 business days of [place] receiving them.  If abnormal or we want to clarify a result, we will call or mychart you to make sure you receive the message.  If you have questions or concerns or don't hear within 5-7 days, please send [place] a message or call us.

## 2019-06-12 NOTE — Progress Notes (Signed)
Phone: [phone]   Subjective:  Patient presents today for their annual physical. Chief complaint-noted.   See problem oriented charting- ROS- full  review of systems was completed and negative except for:  Some knee pain  The following were reviewed and entered/updated in epic: Past Medical History:  Diagnosis Date  . Hyperlipidemia    Patient Active Problem List   Diagnosis Date Noted  . Essential hypertension 03/18/2015    Priority: Medium  . Hyperlipidemia 07/12/2008    Priority: Medium  . Osteopenia 01/04/2015    Priority: Low  . Rash 01/04/2015    Priority: Low  . History of skin cancer 05/30/2018   Past Surgical History:  Procedure Laterality Date  . APPENDECTOMY     along with other surgery  . 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 TOTAL ABDOMINAL HYSTERECTOMY W/ BILATERAL SALPINGOOPHORECTOMY     hysterectomy, still gets paps with obgyn    Family History  Problem Relation Age of Onset  . Stroke Mother 81  . Heart disease Father 42       sudden death while sleeping, no autopsy. prior smoker    Medications- reviewed and updated Current Outpatient Medications  Medication Sig Dispense Refill  . aspirin 81 MG tablet Take 81 mg by mouth daily.      Marland Kitchen atorvastatin (LIPITOR) 20 MG tablet TAKE 1 TABLET(20 MG) BY MOUTH 1 TIME A WEEK 14 tablet 3  . B Complex-Biotin-FA (SUPER B-100) TABS Take 1 tablet by mouth daily.      . Calcium Carbonate-Vitamin D (CALCIUM-VITAMIN D) 600-125 MG-UNIT TABS Take 1 tablet by mouth daily.     . Cholecalciferol (VITAMIN D3) 1000 UNITS tablet Take 1,000 Units by mouth daily.      . diclofenac sodium (VOLTAREN) 1 % GEL Apply topically to affected area qid 100 g 1  . Flaxseed, Linseed, (FLAX SEED OIL) 1000 MG CAPS Take 1,400 mg by mouth 2 (two) times daily.     . hydrochlorothiazide (HYDRODIURIL) 25 MG tablet Take 0.5 tablets (12.5 mg total) by mouth daily. 46 tablet 3  . Multiple Vitamin (MULTIVITAMIN) capsule Take 1  capsule by mouth daily.      . Omega-3 Fatty Acids (FISH OIL) 500 MG CAPS Take 1,200 mg by mouth 2 (two) times daily.     Marland Kitchen triamcinolone cream (KENALOG) 0.5 % Apply 1 application topically 2 (two) times daily as needed. rash 30 g 1   No current facility-administered medications for this visit.     Allergies-reviewed and updated No Known Allergies  Social History   Social History Narrative   Married (husband patient [name] of Dr. Yong Channel), no children, no pets.       Works as an Forensic psychologist- Medical sales representative: walking, working in yard   Objective  Objective:  BP 128/80   Pulse 83   Temp 98 F (36.7 C)   Ht 5\' 4"  (1.626 m)   Wt 166 lb (75.3 kg)   SpO2 98%   BMI 28.49 kg/m  Gen: NAD, resting comfortably HEENT: Mucous membranes are moist. Oropharynx normal Neck: no thyromegaly CV: RRR no murmurs rubs or gallops Lungs: CTAB no crackles, wheeze, rhonchi Abdomen: soft/nontender/nondistended/normal bowel sounds. No rebound or guarding.  Ext: no edema Skin: warm, dry Neuro: grossly normal, moves all extremities, PERRLA   Assessment and Plan   68 y.o. female presenting for annual physical.  Health Maintenance counseling: 1. Anticipatory guidance: Patient counseled  regarding regular dental exams -q4 months typically, eye exams -usually yearly,  avoiding smoking and second hand smoke , limiting alcohol to 1 beverage per day .   2. Risk factor reduction:  Advised patient of need for regular exercise and diet rich and fruits and vegetables to reduce risk of heart attack and stroke. Exercise/diet- pt states she is getting back in to her exercise daily and her diet is regular, she cooks a lot not a lot of fast food- feels like snacking is a big issue.  Weight up 5 pounds from last year. She would like to drop 5-10 lbs.  Wt Readings from Last 3 Encounters:  06/13/19 166 lb (75.3 kg)  01/19/19 165 lb (74.8 kg)  06/22/18 164 lb 9.6 oz (74.7 kg)  3.  Immunizations/screenings/ancillary studies-high-dose flu shot today. Discussed Shingrix-defers for now could get shingrix  Immunization History  Administered Date(s) Administered  . Fluad Quad(high Dose 65+) 06/13/2019  . Influenza Split 06/06/2012  . Influenza, High Dose Seasonal PF 07/01/2017, 06/22/2018  . Influenza,inj,Quad PF,6+ Mos 06/07/2013, 06/04/2014, 05/30/2015  . Pneumococcal Conjugate-13 06/04/2014  . Pneumococcal Polysaccharide-23 05/30/2015  . Td 09/08/2003  . Tdap 06/11/2016  . Zoster 02/03/2011   4. Cervical cancer screening- follows with GYN-passed age based screening requirements 5. Breast cancer screening-  breast exam with GYN and mammogram October 31, 2018 3D mammogram 6. Colon cancer screening - October 30, 2009 with 10-year follow-up planned 7. Skin cancer screening-history of skin cancer-follows with skin surgery center. advised regular sunscreen use. Denies worrisome, changing, or new skin lesions.  8. Birth control/STD check- postmenopausal/monogamous 9. Osteoporosis screening at 57- bone density September 2019-likely repeat 2 to 3 years -Never smoker  Status of chronic or acute concerns   HTN-pt states she has not been checking her BP at home and she denies HA, dizziness or chest discomfort.  Blood pressure is well controlled today on hydrochlorothiazide 12.5 mg  Hyperlipidemia-compliant with atorvastatin once a week-update lipid panel  Osteopenia-worst T score September 2019 was -2.1 at right femur.  Takes calcium and vitamin D daily.  Knows the importance of weightbearing exercise.  Mild elevation of FRAX score at 3.6% for hip fracture.  With elevated hip fracture risk- we decided to repeat September 2021-has wanted to hold off on Fosamax  Knee osteoarthritis- Voltaren gel as needed- not needing her much  Seasonal allergies-  Doing well lately   Rash on ears- triamcinolone prn. Head and shoulders recommended by dermatology- not sure if it is helping.  Also given a preventative medicine- better lately.   Recommended follow up: 6 month follow up  Lab/Order associations: fasting   ICD-10-CM   1. Preventative health care  Z00.00 CBC    Lipid panel    Comprehensive metabolic panel  2. Essential hypertension  I10 CBC    Lipid panel    Comprehensive metabolic panel  3. Hyperlipidemia, unspecified hyperlipidemia type  E78.5 CBC    Lipid panel    Comprehensive metabolic panel  4. Osteopenia of neck of left femur  M85.852   5. Need for immunization against influenza  Z23 Flu Vaccine QUAD High Dose(Fluad)    Meds ordered this encounter  Medications  . atorvastatin (LIPITOR) 20 MG tablet    Sig: TAKE 1 TABLET(20 MG) BY MOUTH 1 TIME A WEEK    Dispense:  14 tablet    Refill:  3  . hydrochlorothiazide (HYDRODIURIL) 25 MG tablet    Sig: Take 0.5 tablets (12.5 mg total) by mouth daily.  Dispense:  46 tablet    Refill:  3  . triamcinolone cream (KENALOG) 0.5 %    Sig: Apply 1 application topically 2 (two) times daily as needed. rash    Dispense:  30 g    Refill:  1    Return precautions advised.  Garret Reddish, MD

## 2019-06-13 ENCOUNTER — Other Ambulatory Visit: Payer: Self-pay

## 2019-06-13 ENCOUNTER — Ambulatory Visit (INDEPENDENT_AMBULATORY_CARE_PROVIDER_SITE_OTHER): Payer: Medicare HMO | Admitting: Family Medicine

## 2019-06-13 ENCOUNTER — Encounter: Payer: Self-pay | Admitting: Family Medicine

## 2019-06-13 VITALS — BP 128/80 | HR 83 | Temp 98.0°F | Ht 64.0 in | Wt 166.0 lb

## 2019-06-13 DIAGNOSIS — I10 Essential (primary) hypertension: Secondary | ICD-10-CM

## 2019-06-13 DIAGNOSIS — Z23 Encounter for immunization: Secondary | ICD-10-CM | POA: Diagnosis not present

## 2019-06-13 DIAGNOSIS — E785 Hyperlipidemia, unspecified: Secondary | ICD-10-CM

## 2019-06-13 DIAGNOSIS — Z0000 Encounter for general adult medical examination without abnormal findings: Secondary | ICD-10-CM | POA: Diagnosis not present

## 2019-06-13 DIAGNOSIS — M85852 Other specified disorders of bone density and structure, left thigh: Secondary | ICD-10-CM

## 2019-06-13 LAB — CBC
HCT: 41.1 % (ref 36.0–46.0)
Hemoglobin: 13.6 g/dL (ref 12.0–15.0)
MCHC: 33.1 g/dL (ref 30.0–36.0)
MCV: 89.1 fl (ref 78.0–100.0)
Platelets: 291 10*3/uL (ref 150.0–400.0)
RBC: 4.61 Mil/uL (ref 3.87–5.11)
RDW: 13.4 % (ref 11.5–15.5)
WBC: 6 10*3/uL (ref 4.0–10.5)

## 2019-06-13 LAB — COMPREHENSIVE METABOLIC PANEL
ALT: 24 U/L (ref 0–35)
AST: 23 U/L (ref 0–37)
Albumin: 4.4 g/dL (ref 3.5–5.2)
Alkaline Phosphatase: 62 U/L (ref 39–117)
BUN: 16 mg/dL (ref 6–23)
CO2: 30 mEq/L (ref 19–32)
Calcium: 9.9 mg/dL (ref 8.4–10.5)
Chloride: 104 mEq/L (ref 96–112)
Creatinine, Ser: 0.75 mg/dL (ref 0.40–1.20)
GFR: 76.39 mL/min (ref >60.00)
Glucose, Bld: 90 mg/dL (ref 70–99)
Potassium: 3.8 mEq/L (ref 3.5–5.1)
Sodium: 140 mEq/L (ref 135–145)
Total Bilirubin: 0.7 mg/dL (ref 0.2–1.2)
Total Protein: 6.6 g/dL (ref 6.0–8.3)

## 2019-06-13 LAB — LIPID PANEL
Cholesterol: 229 mg/dL — ABNORMAL HIGH (ref 0–200)
HDL: 75.2 mg/dL (ref >39.00)
LDL Cholesterol: 138 mg/dL — ABNORMAL HIGH (ref 0–99)
NonHDL: 154.26
Total CHOL/HDL Ratio: 3
Triglycerides: 80 mg/dL (ref 0.0–149.0)
VLDL: 16 mg/dL (ref 0.0–40.0)

## 2019-06-13 MED ORDER — ATORVASTATIN CALCIUM 20 MG PO TABS: ORAL_TABLET | ORAL | 3 refills | Status: DC

## 2019-06-13 MED ORDER — HYDROCHLOROTHIAZIDE 25 MG PO TABS: 12.5000 mg | ORAL_TABLET | Freq: Every day | ORAL | 3 refills | Status: DC

## 2019-06-13 MED ORDER — TRIAMCINOLONE ACETONIDE 0.5 % EX CREA: 1.0000 "application " | TOPICAL_CREAM | Freq: Two times a day (BID) | CUTANEOUS | 1 refills | Status: DC | PRN

## 2019-07-14 ENCOUNTER — Ambulatory Visit: Payer: Self-pay | Admitting: *Deleted

## 2019-07-14 NOTE — Telephone Encounter (Signed)
I was going to return the call to the pt however Janeann Forehand, RN at [facility] has addressed this pt's concern and forwarded it to the care team for Dr. Yong Channel.

## 2019-07-14 NOTE — Telephone Encounter (Signed)
Copied from [place] [id number]. 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 Per patient she was not in direct contact but in the same office asking for a call back to discuss at Ph#  [phone]

## 2019-07-14 NOTE — Telephone Encounter (Signed)
Called pt back and lm tcb.

## 2019-08-18 ENCOUNTER — Telehealth: Payer: Self-pay | Admitting: Family Medicine

## 2019-08-18 NOTE — Telephone Encounter (Signed)
Medication Refill - Medication: atorvastatin (LIPITOR) 20 MG tablet hydrochlorothiazide (HYDRODIURIL) 25 MG tablet  Has the patient contacted their pharmacy? No - switching pharmacies (Agent: If no, request that the patient contact the pharmacy for the refill.) (Agent: If yes, when and what did the pharmacy advise?)  Preferred Pharmacy (with phone number or street name):  [id number] - [place], [place] Phone:  [phone]  Fax:  [phone]     Agent: Please be advised that RX refills may take up to 3 business days. We ask that you follow-up with your pharmacy.

## 2019-08-28 ENCOUNTER — Other Ambulatory Visit: Payer: Self-pay

## 2019-08-28 MED ORDER — HYDROCHLOROTHIAZIDE 25 MG PO TABS: 12.5000 mg | ORAL_TABLET | Freq: Every day | ORAL | 3 refills | Status: DC

## 2019-08-28 MED ORDER — ATORVASTATIN CALCIUM 20 MG PO TABS: ORAL_TABLET | ORAL | 3 refills | Status: DC

## 2019-08-28 NOTE — Telephone Encounter (Signed)
[facility]

## 2019-08-28 NOTE — Telephone Encounter (Signed)
Pt does not need refills for atorvastatin (LIPITOR) 20 MG tablet hydrochlorothiazide (HYDRODIURIL) 25 MG tablet  because there are still refills left. She has switched pharmacies and needs current rx and current refills sent to new pharmacy([facility]). Please advise. Pt stated she would try to fill at old pharmacy [facility]) because she is out but would like remaining refills sent to new pharmacy.  [id number] [address], [place] Phone:  [phone]  Fax:  [phone]

## 2019-11-17 ENCOUNTER — Other Ambulatory Visit: Payer: Self-pay | Admitting: Family Medicine

## 2019-11-17 DIAGNOSIS — Z1231 Encounter for screening mammogram for malignant neoplasm of breast: Secondary | ICD-10-CM

## 2019-12-06 ENCOUNTER — Ambulatory Visit
Admission: RE | Admit: 2019-12-06 | Discharge: 2019-12-06 | Disposition: A | Discharge status: Home / Self Care | Payer: Medicare HMO | Source: Ambulatory Visit | Attending: Family Medicine | Admitting: Family Medicine

## 2019-12-06 ENCOUNTER — Other Ambulatory Visit: Payer: Self-pay

## 2019-12-06 DIAGNOSIS — Z1231 Encounter for screening mammogram for malignant neoplasm of breast: Secondary | ICD-10-CM | POA: Diagnosis not present

## 2020-01-04 DIAGNOSIS — Z01419 Encounter for gynecological examination (general) (routine) without abnormal findings: Secondary | ICD-10-CM | POA: Diagnosis not present

## 2020-01-15 ENCOUNTER — Telehealth: Payer: Self-pay | Admitting: Family Medicine

## 2020-01-15 NOTE — Telephone Encounter (Signed)
I left a message asking the patient to call and schedule Medicare AWV with Loma Sousa ([place]).  If patient calls back, please schedule Medicare Wellness Visit at next available opening. Last AWV 01/19/2019 [facility] ([place])

## 2020-01-31 DIAGNOSIS — M1711 Unilateral primary osteoarthritis, right knee: Secondary | ICD-10-CM | POA: Diagnosis not present

## 2020-01-31 DIAGNOSIS — M25561 Pain in right knee: Secondary | ICD-10-CM | POA: Diagnosis not present

## 2020-02-01 DIAGNOSIS — M1711 Unilateral primary osteoarthritis, right knee: Secondary | ICD-10-CM | POA: Diagnosis not present

## 2020-02-01 DIAGNOSIS — M222X1 Patellofemoral disorders, right knee: Secondary | ICD-10-CM | POA: Diagnosis not present

## 2020-02-12 DIAGNOSIS — M222X1 Patellofemoral disorders, right knee: Secondary | ICD-10-CM | POA: Diagnosis not present

## 2020-02-12 DIAGNOSIS — M1711 Unilateral primary osteoarthritis, right knee: Secondary | ICD-10-CM | POA: Diagnosis not present

## 2020-02-21 DIAGNOSIS — M1711 Unilateral primary osteoarthritis, right knee: Secondary | ICD-10-CM | POA: Diagnosis not present

## 2020-02-21 DIAGNOSIS — M222X1 Patellofemoral disorders, right knee: Secondary | ICD-10-CM | POA: Diagnosis not present

## 2020-02-28 DIAGNOSIS — M1711 Unilateral primary osteoarthritis, right knee: Secondary | ICD-10-CM | POA: Diagnosis not present

## 2020-02-28 DIAGNOSIS — M222X1 Patellofemoral disorders, right knee: Secondary | ICD-10-CM | POA: Diagnosis not present

## 2020-03-06 DIAGNOSIS — M222X1 Patellofemoral disorders, right knee: Secondary | ICD-10-CM | POA: Diagnosis not present

## 2020-03-06 DIAGNOSIS — M1711 Unilateral primary osteoarthritis, right knee: Secondary | ICD-10-CM | POA: Diagnosis not present

## 2020-03-15 DIAGNOSIS — M222X1 Patellofemoral disorders, right knee: Secondary | ICD-10-CM | POA: Diagnosis not present

## 2020-03-15 DIAGNOSIS — M1711 Unilateral primary osteoarthritis, right knee: Secondary | ICD-10-CM | POA: Diagnosis not present

## 2020-04-05 DIAGNOSIS — M1711 Unilateral primary osteoarthritis, right knee: Secondary | ICD-10-CM | POA: Diagnosis not present

## 2020-04-05 DIAGNOSIS — M222X1 Patellofemoral disorders, right knee: Secondary | ICD-10-CM | POA: Diagnosis not present

## 2020-04-13 IMAGING — MG DIGITAL SCREENING BILATERAL MAMMOGRAM WITH TOMO AND CAD
8 series · 8 of 24 positions shown · non-contrast
Comparison: Previous exam(s).

CLINICAL DATA: Screening.

EXAM:
DIGITAL SCREENING BILATERAL MAMMOGRAM WITH TOMO AND CAD

[L MLO synth-2D]
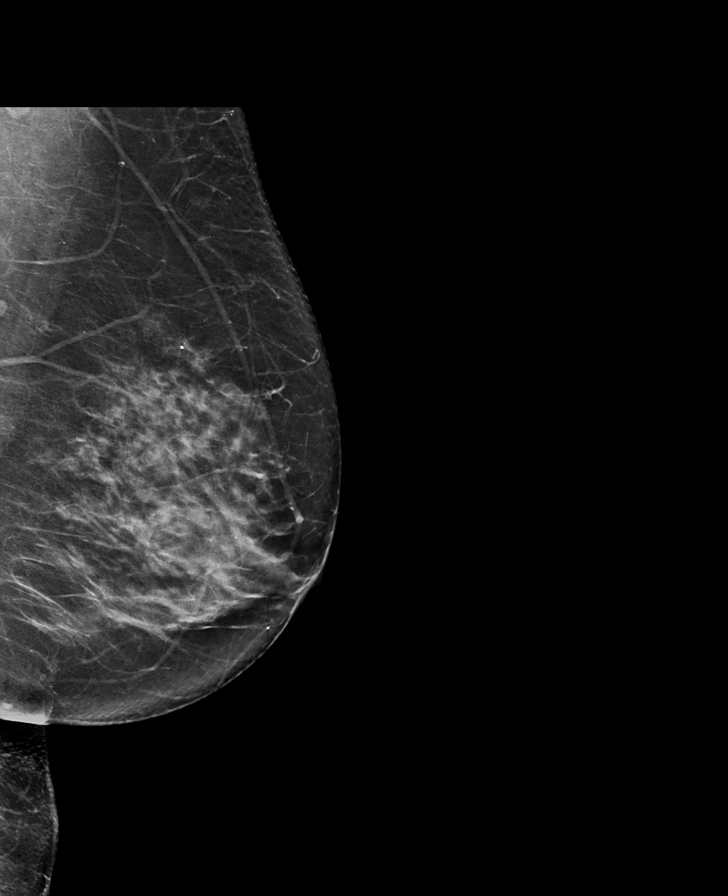

[L CC synth-2D]
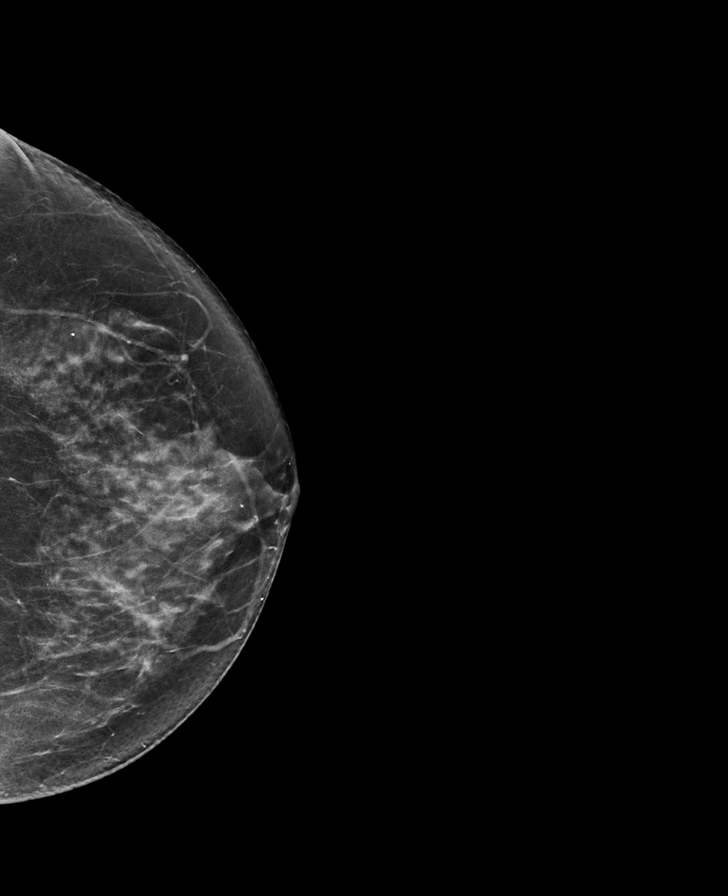

[R MLO synth-2D]
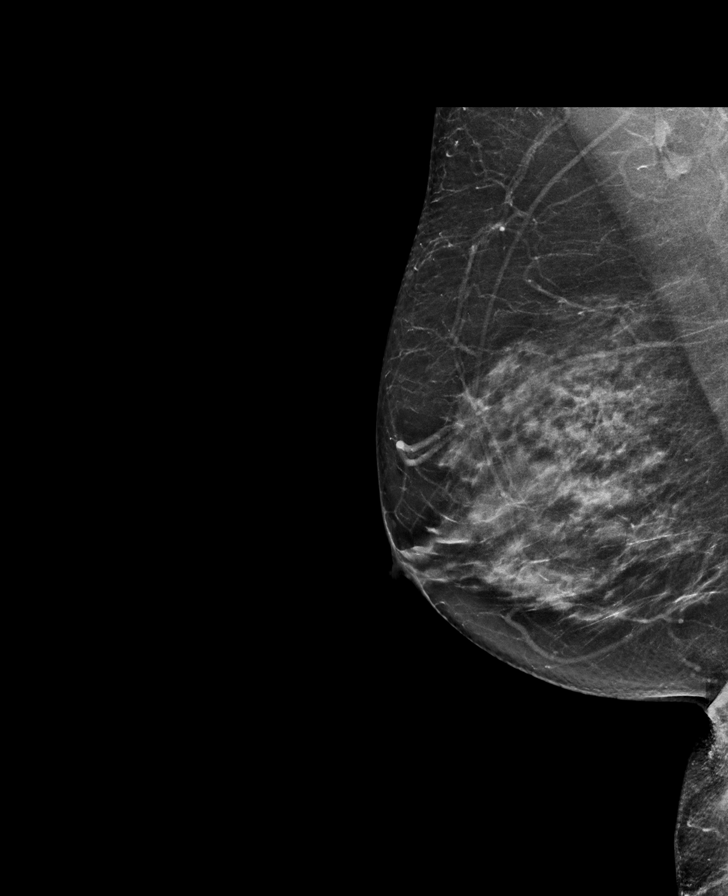

[R CC synth-2D]
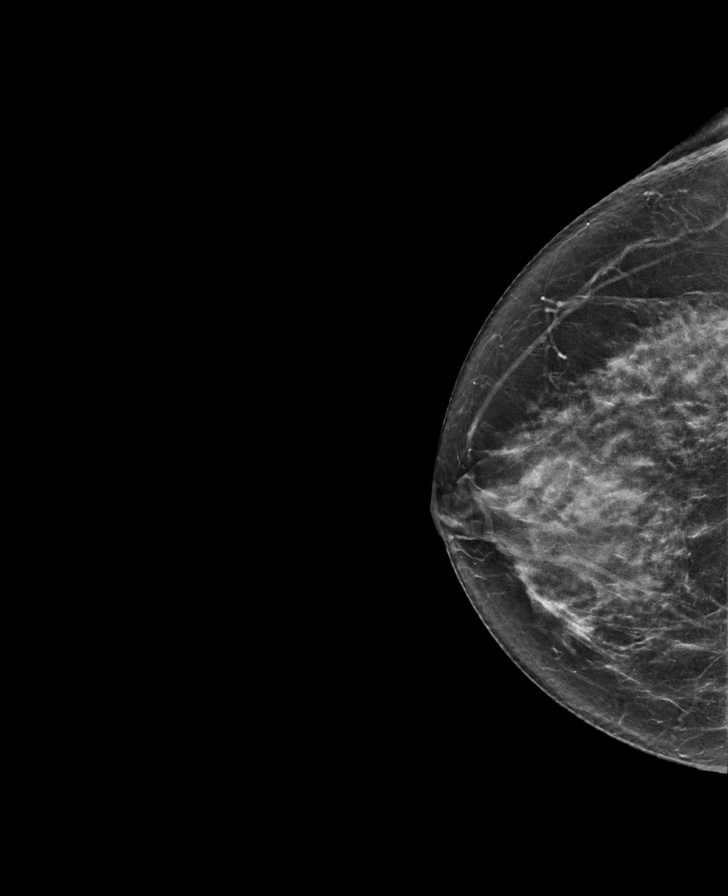

[L MLO tomo · tomo slice 39/77.0]
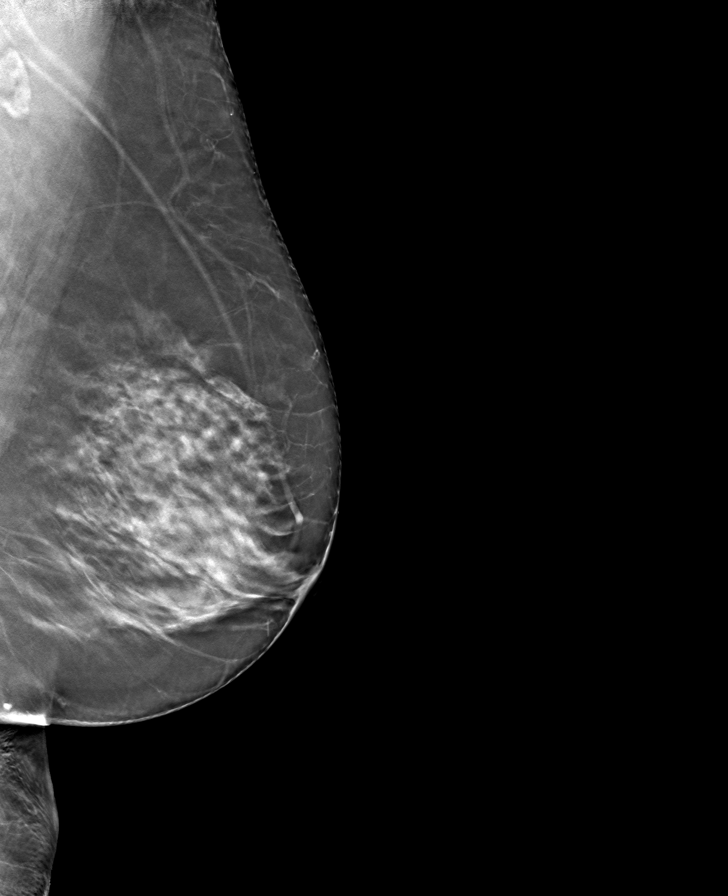

[L CC tomo · tomo slice 37/72.0]
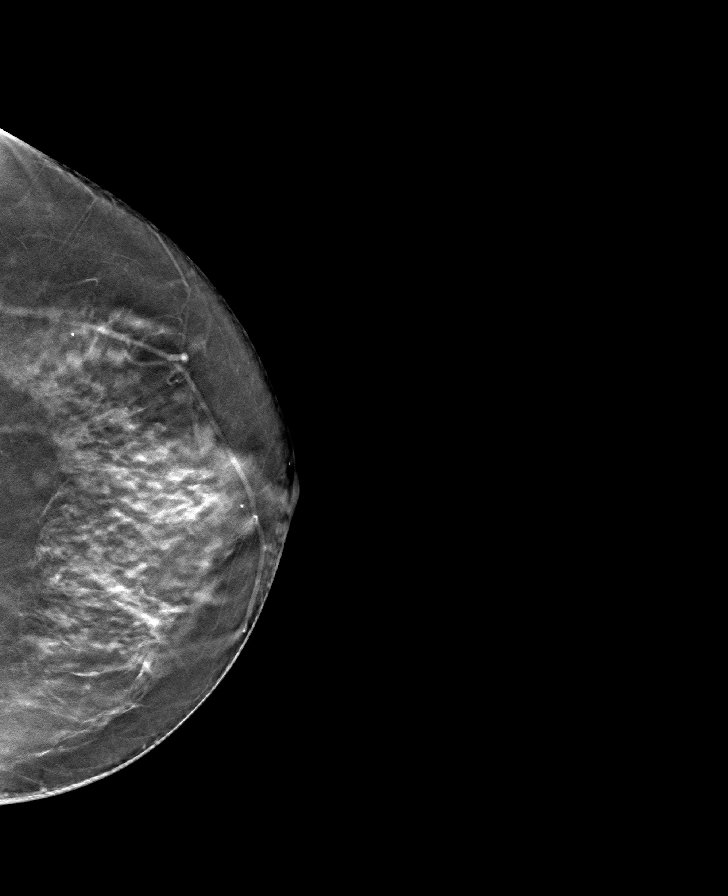

[R MLO tomo · tomo slice 35/70.0]
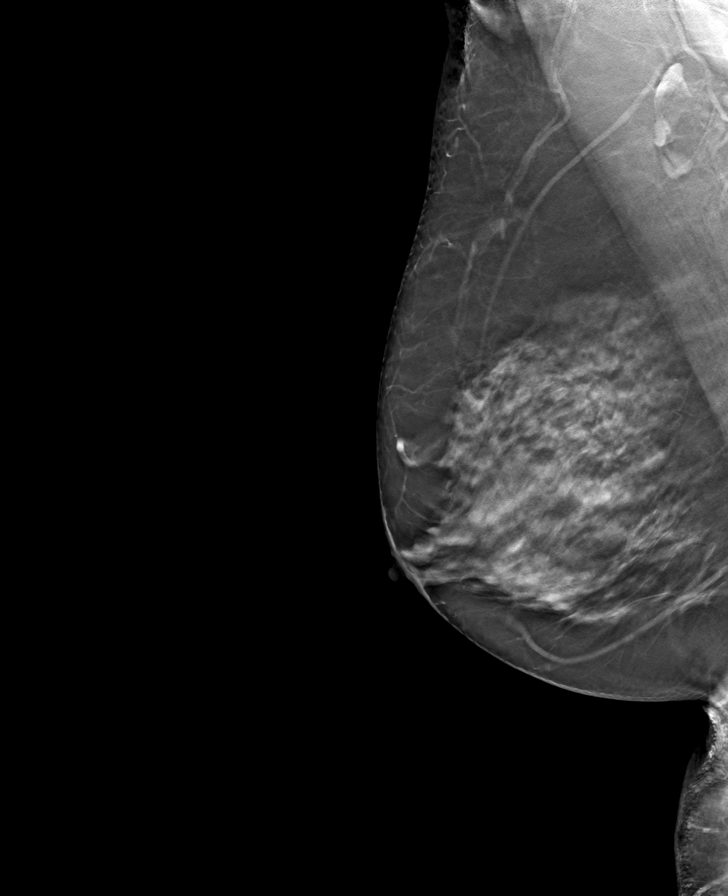

[R CC tomo · tomo slice 36/71.0]
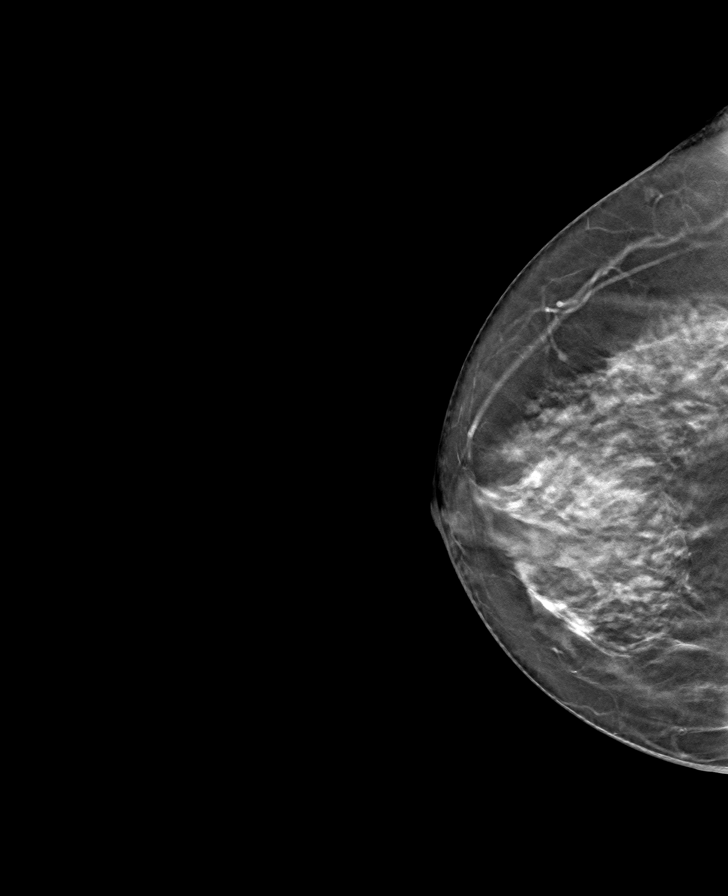

[8 of 24 positions shown; findings below may reference images not displayed]

ACR Breast Density Category c: The breast tissue is heterogeneously
dense, which may obscure small masses.
FINDINGS: There are no findings suspicious for malignancy. Images were
processed with CAD.
IMPRESSION: No mammographic evidence of malignancy. A result letter of this
screening mammogram will be mailed directly to the patient.

RECOMMENDATION:
Screening mammogram in one year. (Code:[id number])

BI-RADS CATEGORY  1: Negative.

## 2020-04-26 DIAGNOSIS — L905 Scar conditions and fibrosis of skin: Secondary | ICD-10-CM | POA: Diagnosis not present

## 2020-04-26 DIAGNOSIS — D1801 Hemangioma of skin and subcutaneous tissue: Secondary | ICD-10-CM | POA: Diagnosis not present

## 2020-04-26 DIAGNOSIS — S80812A Abrasion, left lower leg, initial encounter: Secondary | ICD-10-CM | POA: Diagnosis not present

## 2020-04-26 DIAGNOSIS — D229 Melanocytic nevi, unspecified: Secondary | ICD-10-CM | POA: Diagnosis not present

## 2020-04-26 DIAGNOSIS — Z85828 Personal history of other malignant neoplasm of skin: Secondary | ICD-10-CM | POA: Diagnosis not present

## 2020-04-26 DIAGNOSIS — L821 Other seborrheic keratosis: Secondary | ICD-10-CM | POA: Diagnosis not present

## 2020-04-26 DIAGNOSIS — L218 Other seborrheic dermatitis: Secondary | ICD-10-CM | POA: Diagnosis not present

## 2020-06-04 ENCOUNTER — Ambulatory Visit: Payer: Medicare HMO | Attending: Internal Medicine

## 2020-06-04 DIAGNOSIS — Z23 Encounter for immunization: Secondary | ICD-10-CM

## 2020-06-04 NOTE — Progress Notes (Signed)
   Covid-19 Vaccination Clinic  Name:  Toni Mcgrath    MRN: [id number] DOB: 06-11-1951  06/04/2020  Toni Mcgrath was observed post Covid-19 immunization for 15 minutes without incident. She was provided with Vaccine Information Sheet and instruction to access the V-Safe system.   Toni Mcgrath was instructed to call 911 with any severe reactions post vaccine: Marland Kitchen Difficulty breathing  . Swelling of face and throat  . A fast heartbeat  . A bad rash all over body  . Dizziness and weakness

## 2020-06-18 NOTE — Progress Notes (Signed)
Phone [phone]   Subjective:  Patient presents today for their annual physical. Chief complaint-noted.   See problem oriented charting- ROS- full  review of systems was completed and negative except for: seasonal allergies  The following were reviewed and entered/updated in epic: Past Medical History:  Diagnosis Date  . Hyperlipidemia    Patient Active Problem List   Diagnosis Date Noted  . Essential hypertension 03/18/2015    Priority: Medium  . Hyperlipidemia 07/12/2008    Priority: Medium  . Osteopenia 01/04/2015    Priority: Low  . Rash 01/04/2015    Priority: Low  . History of skin cancer 05/30/2018   Past Surgical History:  Procedure Laterality Date  . APPENDECTOMY     along with other surgery  . 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 TOTAL ABDOMINAL HYSTERECTOMY W/ BILATERAL SALPINGOOPHORECTOMY     hysterectomy, still gets paps with obgyn    Family History  Problem Relation Age of Onset  . Stroke Mother 77  . Heart disease Father 44       sudden death while sleeping, no autopsy. prior smoker    Medications- reviewed and updated Current Outpatient Medications  Medication Sig Dispense Refill  . Ascorbic Acid (VITAMIN C PO) Take 1 tablet by mouth daily.    Marland Kitchen aspirin 81 MG tablet Take 81 mg by mouth daily.      Marland Kitchen atorvastatin (LIPITOR) 20 MG tablet TAKE 1 TABLET(20 MG) BY MOUTH 1 TIME A WEEK 14 tablet 3  . B Complex-Biotin-FA (SUPER B-100) TABS Take 1 tablet by mouth daily.      . Calcium Carbonate-Vitamin D (CALCIUM-VITAMIN D) 600-125 MG-UNIT TABS Take 1 tablet by mouth daily.     . Cholecalciferol (VITAMIN D3) 1000 UNITS tablet Take 1,000 Units by mouth daily.      . diclofenac sodium (VOLTAREN) 1 % GEL Apply topically to affected area qid (Patient taking differently: as needed. ) 100 g 1  . Flaxseed, Linseed, (FLAX SEED OIL) 1000 MG CAPS Take 1,400 mg by mouth daily.     . hydrochlorothiazide (HYDRODIURIL) 25 MG tablet Take 0.5 tablets  (12.5 mg total) by mouth daily. 46 tablet 3  . Multiple Vitamin (MULTIVITAMIN) capsule Take 1 capsule by mouth daily.      . Omega-3 Fatty Acids (FISH OIL) 500 MG CAPS Take 1,200 mg by mouth daily.     Marland Kitchen triamcinolone cream (KENALOG) 0.5 % Apply 1 application topically 2 (two) times daily as needed. rash 30 g 1  . ZINC OXIDE PO Take 1 tablet by mouth daily.     No current facility-administered medications for this visit.   Allergies-reviewed and updated No Known Allergies  Social History   Social History Narrative   Married (husband patient [name] of Dr. Yong Channel), no children, no pets.       Works as an Forensic psychologist- Medical sales representative: walking, working in yard   Objective  Objective:  BP 124/76   Pulse 70   Temp (!) 96.6 F (35.9 C) (Temporal)   Resp 18   Ht 5\' 4"  (1.626 m)   Wt 168 lb 3.2 oz (76.3 kg)   SpO2 97%   BMI 28.87 kg/m  Gen: NAD, resting comfortably HEENT: Mucous membranes are moist. Oropharynx normal, left ear full of wax- discussed cerumen  Neck: no thyromegaly CV: RRR no murmurs rubs or gallops Lungs: CTAB no crackles, wheeze, rhonchi Abdomen: soft/nontender/nondistended/normal bowel sounds. No  rebound or guarding.  Ext: no edema Skin: warm, dry Neuro: grossly normal, moves all extremities, PERRLA   Assessment and Plan   69 y.o. female presenting for annual physical.  Health Maintenance counseling: 1. Anticipatory guidance: Patient counseled regarding regular dental exams q6 months-she goes every 4 months, eye exams - yearly typically,  avoiding smoking and second hand smoke, limiting alcohol to 1 beverage per day.   2. Risk factor reduction:  Advised patient of need for regular exercise and diet rich and fruits and vegetables to reduce risk of heart attack and stroke. Exercise- not as good lately- was trying to restart walking this summer but had a knee injury - had to use cane short term and had to go through rehab- needs to restart her  exercises. Encouraged her to restart her walking and goal 150 minutes a week. Trying to ease back in Diet-more sedentary as above. Wants to cut down on her cheese and salt. Going to try to improve on getting fruit intake.  Wt Readings from Last 3 Encounters:  06/20/20 168 lb 3.2 oz (76.3 kg)  06/13/19 166 lb (75.3 kg)  01/19/19 165 lb (74.8 kg)  3. Immunizations/screenings/ancillary studies- high dose flu shot today.  Immunization History  Administered Date(s) Administered  . Fluad Quad(high Dose 65+) 06/13/2019  . Influenza Split 06/06/2012  . Influenza, High Dose Seasonal PF 07/01/2017, 06/22/2018  . Influenza,inj,Quad PF,6+ Mos 06/07/2013, 06/04/2014, 05/30/2015  . PFIZER SARS-COV-2 Vaccination 09/27/2019, 10/18/2019, 06/04/2020  . Pneumococcal Conjugate-13 06/04/2014  . Pneumococcal Polysaccharide-23 05/30/2015  . Td 09/08/2003  . Tdap 06/11/2016  . Unspecified SARS-COV-2 Vaccination 09/27/2019, 10/18/2019  . Zoster 02/03/2011  4. Cervical cancer screening- still follows with GYN- passed age based screening recommendations 5. Breast cancer screening-  breast exam with GYN and mammogram mammogram 12/06/19  6. Colon cancer screening - scheduled for December 2021 7. Skin cancer screening- skin surgery center in august. advised regular sunscreen use. Denies worrisome, changing, or new skin lesions.  8. Birth control/STD check- postmenopausal/monogamous  9. Osteoporosis screening at 54- see below -Never smoker  Status of chronic or acute concerns   #hypertension S: medication: Hydrochlorothiazide 12.5 mg BP Readings from Last 3 Encounters:  06/20/20 124/76  06/13/19 128/80  06/22/18 138/80  A/P: Stable. Continue current medications.    #hyperlipidemia S: Medication:Atorvastatin 20 mg once a week and fish oil Had recommended considering every other day Lab Results  Component Value Date   CHOL 229 (H) 06/13/2019   HDL 75.20 06/13/2019   LDLCALC 138 (H) 06/13/2019   LDLDIRECT  160.5 06/01/2013   TRIG 80.0 06/13/2019   CHOLHDL 3 06/13/2019  A/P: discussed somewhat elevated cholesterol-we discussed possibly increasing to every other day- she might be interested in coronary calcium scoring as she would want to avoid increasing dose unless levels high.    # Low Bone density (formerly osteopenia) S: Last DEXA: September 2019 worst T score -2.1 at right femur.  FRAX elevation 3.6 for hip fracture-she desired to hold off on Fosamax  Calcium: 1200mg  (through diet ok) recommended  Vitamin D: 1000 units a day recommended  Weightbearing exercise-restarting this  A/P: Osteopenia noted-we had planned to do a bone density this year but we will defer until next year to give her some time to get back into her walking regimen.  She will continue calcium and vitamin D  #Knee osteoarthritis-Voltaren gel as needed in past- has not had to use for the left recently. Prior right knee injury in last few months  saw [facility]  #Seasonal allergies-reasonable this year  ##Rash on ears-triamcinolone as needed.  Head and shoulders was recommended by dermatology but she is not sure it was helpful.  Recommended follow up: Return in about 1 year (around 06/20/2021) for physical or sooner if needed. Future Appointments  Date Time Provider [place]  08/05/2020  8:00 AM [facility] PREVISIT RM 51 [facility]  08/08/2020  9:00 AM Cirigliano, Vito V, DO [facility]   Lab/Order associations: fasting   ICD-10-CM   1. Preventative health care  J48.30 COMPLETE METABOLIC PANEL WITH GFR    Lipid panel    CBC with Differential/Platelet  2. Essential hypertension  N35 COMPLETE METABOLIC PANEL WITH GFR    Lipid panel    CBC with Differential/Platelet  3. Hyperlipidemia, unspecified hyperlipidemia type  Q30.1 COMPLETE METABOLIC PANEL WITH GFR    Lipid panel    CBC with Differential/Platelet  4. Osteopenia of neck of left femur  M85.852     No orders of the defined  types were placed in this encounter.   Return precautions advised.  Garret Reddish, MD

## 2020-06-18 NOTE — Patient Instructions (Addendum)
Please stop by lab before you go If you have mychart- we will send your results within 3 business days of [place] receiving them.  If you do not have mychart- we will call you about results within 5 business days of [place] receiving them.  *please note we are currently using [facility] which has a longer processing time than [facility] typically so labs may not come back as quickly as in the past *please also note that you will see labs on mychart as soon as they post. I will later go in and write notes on them- will say "notes from Dr. Yong Channel"  Mineral oil for ear full of wax or can try debrox Purchase mineral oil from laxative aisle Lay down on your side with ear that is bothering you facing up Use 3-4 drops with a dropper and place in ear for 30 seconds Place cotton swab outside of ear Turn to other side and allow this to drain Repeat 3-4 x a day Return to see [place] if not improving within a few days   Health Maintenance Due  Topic Date Due  . INFLUENZA VACCINE In office flu shot today high dose 04/07/2020   Please check with your pharmacy to see if they have the shingrix vaccine. If they do- please get this immunization and update [place] by phone call or mychart with dates you receive the vaccine     Toni Mcgrath , Thank you for taking time to come for your Medicare Wellness Visit. I appreciate your ongoing commitment to your health goals. Please review the following plan we discussed and let me know if I can assist you in the future.   These are the goals we discussed: Goals    . Exercise 150 min/wk Moderate Activity     Try to walk 2 miles 5 days a week Lose 5 to 10 lbs Will add a few weights         This is a list of the screening recommended for you and due dates:  Health Maintenance  Topic Date Due  . Colon Cancer Screening  10/31/2019  . Flu Shot  04/07/2020  .  Hepatitis C: One time screening is recommended by Center for Disease Control  (CDC) for  adults born from [birth year] through  1965.   09/03/2098*  . Mammogram  12/05/2021  . Tetanus Vaccine  06/11/2026  . DEXA scan (bone density measurement)  Completed  . COVID-19 Vaccine  Completed  . Pneumonia vaccines  Completed  *Topic was postponed. The date shown is not the original due date.   j

## 2020-06-19 ENCOUNTER — Encounter: Payer: Self-pay | Admitting: Gastroenterology

## 2020-06-20 ENCOUNTER — Ambulatory Visit (INDEPENDENT_AMBULATORY_CARE_PROVIDER_SITE_OTHER): Payer: Medicare HMO | Admitting: Family Medicine

## 2020-06-20 ENCOUNTER — Other Ambulatory Visit: Payer: Self-pay

## 2020-06-20 ENCOUNTER — Encounter: Payer: Self-pay | Admitting: Family Medicine

## 2020-06-20 VITALS — BP 124/76 | HR 70 | Temp 96.6°F | Resp 18 | Ht 64.0 in | Wt 168.2 lb

## 2020-06-20 DIAGNOSIS — E785 Hyperlipidemia, unspecified: Secondary | ICD-10-CM

## 2020-06-20 DIAGNOSIS — Z23 Encounter for immunization: Secondary | ICD-10-CM

## 2020-06-20 DIAGNOSIS — Z0000 Encounter for general adult medical examination without abnormal findings: Secondary | ICD-10-CM

## 2020-06-20 DIAGNOSIS — Z1211 Encounter for screening for malignant neoplasm of colon: Secondary | ICD-10-CM

## 2020-06-20 DIAGNOSIS — I10 Essential (primary) hypertension: Secondary | ICD-10-CM | POA: Diagnosis not present

## 2020-06-20 DIAGNOSIS — M85852 Other specified disorders of bone density and structure, left thigh: Secondary | ICD-10-CM | POA: Diagnosis not present

## 2020-06-20 MED ORDER — TRIAMCINOLONE ACETONIDE 0.5 % EX CREA: 1.0000 "application " | TOPICAL_CREAM | Freq: Two times a day (BID) | CUTANEOUS | 1 refills | Status: DC | PRN

## 2020-06-20 NOTE — Progress Notes (Signed)
Phone [phone]    Subjective:  Patient presents today for their annual wellness visit (subsequent)  Preventive Screening-Counseling & Management  Modifiable Risk Factors/behavioral risk assessment/psychosocial risk assessment Regular exercise: limited by prior injury- discussed restarting exercise Diet: has slipped some- discussed modifications  Wt Readings from Last 3 Encounters:  06/20/20 168 lb 3.2 oz (76.3 kg)  06/13/19 166 lb (75.3 kg)  01/19/19 165 lb (74.8 kg)  Smoking Status: Never Smoker Second Hand Smoking status: No smokers in home Alcohol intake: 7-10 per week. Encouraged limiting to 7  Cardiac risk factors:  advanced age (older than 43 for men, 43 for women)  treated Hyperlipidemia  treated Hypertension  No diabetes.  No results found for: HGBA1C Family History: mom with stroke in 40s, dad with heart disease in 80s likely   Depression Screen/risk evaluation Risk factors: none other than work stress. PHQ2 0  Depression screen [facility] 2/9 06/20/2020 06/13/2019 01/19/2019 01/13/2018 05/06/2017  Decreased Interest 0 0 0 0 0  Down, Depressed, Hopeless 0 0 0 0 0  PHQ - 2 Score 0 0 0 0 0    Functional ability and level of safety Mobility assessment:  timed get up and go <12 seconds Activities of Daily Living- Independent in ADLs (toileting, bathing, dressing, transferring, eating) and in IADLs (shopping, housekeeping, managing own medications, and handling finances) Home Safety: Loose rugs (no), smoke detectors (up to date), small pets (no), grab bars (no but feels safe), stairs (2 sets of stairs but 1 flight- no issues and bedroom on first floor), life-alert system (would use cell phone) Hearing Difficulties: -patient declines typically does worry about wax at times Fall Risk: None  Fall Risk  06/20/2020 06/13/2019 01/19/2019 01/13/2018 05/06/2017  Falls in the past year? 0 0 0 No No  Number falls in past yr: 0 0 - - -  Injury with Fall? 0 0 - - -  Opioid use history:  no  long term opioids use Self assessment of health status: "fair to excellent for age"  Cognitive Testing             No reported trouble.   Mini cog: normal clock draw. 1/3 delayed recall. Normal test result    List the Names of Other Physician/Practitioners you currently use: Patient Care Team: Marin Olp, MD as PCP - General (Family Medicine) Bobbye Charleston, MD as Consulting Physician (Obstetrics and Gynecology) -skin surgery center - [facility]  Required Immunizations needed today:  High dose flu shot today, discussed shingrix Immunization History  Administered Date(s) Administered  . Fluad Quad(high Dose 65+) 06/13/2019  . Influenza Split 06/06/2012  . Influenza, High Dose Seasonal PF 07/01/2017, 06/22/2018  . Influenza,inj,Quad PF,6+ Mos 06/07/2013, 06/04/2014, 05/30/2015  . PFIZER SARS-COV-2 Vaccination 09/27/2019, 10/18/2019, 06/04/2020  . Pneumococcal Conjugate-13 06/04/2014  . Pneumococcal Polysaccharide-23 05/30/2015  . Td 09/08/2003  . Tdap 06/11/2016  . Unspecified SARS-COV-2 Vaccination 09/27/2019, 10/18/2019  . Zoster 02/03/2011   Health Maintenance  Topic Date Due  . Colon Cancer Screening  10/31/2019  . Flu Shot  04/07/2020  .  Hepatitis C: One time screening is recommended by Center for Disease Control  (CDC) for  adults born from [birth year] through 1967.   09/03/2098*  . Mammogram  12/05/2021  . Tetanus Vaccine  06/11/2026  . DEXA scan (bone density measurement)  Completed  . COVID-19 Vaccine  Completed  . Pneumonia vaccines  Completed  *Topic was postponed. The date shown is not the original due date.    Screening tests-  1. Colon cancer screening- scheduled december 2. Lung Cancer screening- not a candidate 3. Skin cancer screening- yearly with dermatology 4. Cervical cancer screening- with GYN 5. Breast cancer screening- had in march and gets breast exam with GYN  The following were reviewed and entered/updated in epic if  appropriate: Past Medical History:  Diagnosis Date  . Hyperlipidemia    Patient Active Problem List   Diagnosis Date Noted  . Essential hypertension 03/18/2015    Priority: Medium  . Hyperlipidemia 07/12/2008    Priority: Medium  . Osteopenia 01/04/2015    Priority: Low  . Rash 01/04/2015    Priority: Low  . History of skin cancer 05/30/2018   Past Surgical History:  Procedure Laterality Date  . APPENDECTOMY     along with other surgery  . 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 TOTAL ABDOMINAL HYSTERECTOMY W/ BILATERAL SALPINGOOPHORECTOMY     hysterectomy, still gets paps with obgyn    Family History  Problem Relation Age of Onset  . Stroke Mother 4  . Heart disease Father 38       sudden death while sleeping, no autopsy. prior smoker    Medications- reviewed and updated Current Outpatient Medications  Medication Sig Dispense Refill  . Ascorbic Acid (VITAMIN C PO) Take 1 tablet by mouth daily.    Marland Kitchen aspirin 81 MG tablet Take 81 mg by mouth daily.      Marland Kitchen atorvastatin (LIPITOR) 20 MG tablet TAKE 1 TABLET(20 MG) BY MOUTH 1 TIME A WEEK 14 tablet 3  . B Complex-Biotin-FA (SUPER B-100) TABS Take 1 tablet by mouth daily.      . Calcium Carbonate-Vitamin D (CALCIUM-VITAMIN D) 600-125 MG-UNIT TABS Take 1 tablet by mouth daily.     . Cholecalciferol (VITAMIN D3) 1000 UNITS tablet Take 1,000 Units by mouth daily.      . diclofenac sodium (VOLTAREN) 1 % GEL Apply topically to affected area qid (Patient taking differently: as needed. ) 100 g 1  . Flaxseed, Linseed, (FLAX SEED OIL) 1000 MG CAPS Take 1,400 mg by mouth daily.     . hydrochlorothiazide (HYDRODIURIL) 25 MG tablet Take 0.5 tablets (12.5 mg total) by mouth daily. 46 tablet 3  . Multiple Vitamin (MULTIVITAMIN) capsule Take 1 capsule by mouth daily.      . Omega-3 Fatty Acids (FISH OIL) 500 MG CAPS Take 1,200 mg by mouth daily.     Marland Kitchen triamcinolone cream (KENALOG) 0.5 % Apply 1 application topically 2 (two)  times daily as needed. rash 30 g 1  . ZINC OXIDE PO Take 1 tablet by mouth daily.     No current facility-administered medications for this visit.    Allergies-reviewed and updated No Known Allergies  Social History   Socioeconomic History  . Marital status: Married    Spouse name: Not on file  . Number of children: Not on file  . Years of education: Not on file  . Highest education level: Not on file  Occupational History  . Not on file  Tobacco Use  . Smoking status: Never Smoker  . Smokeless tobacco: Never Used  Substance and Sexual Activity  . Alcohol use: Yes    Alcohol/week: 3.0 - 4.0 standard drinks    Types: 3 - 4 Glasses of wine per week  . Drug use: No  . Sexual activity: Not on file  Other Topics Concern  . Not on file  Social History Narrative   Married (  husband patient [name] of Dr. Yong Channel), no children, no pets.       Works as an Forensic psychologist- IT consultant      Hobbies: walking, working in yard   Social Determinants of Radio broadcast assistant Strain:   . Difficulty of Paying Living Expenses: Not on file  Food Insecurity:   . Worried About Charity fundraiser in the Last Year: Not on file  . Ran Out of Food in the Last Year: Not on file  Transportation Needs:   . Lack of Transportation (Medical): Not on file  . Lack of Transportation (Non-Medical): Not on file  Physical Activity:   . Days of Exercise per Week: Not on file  . Minutes of Exercise per Session: Not on file  Stress:   . Feeling of Stress : Not on file  Social Connections:   . Frequency of Communication with Friends and Family: Not on file  . Frequency of Social Gatherings with Friends and Family: Not on file  . Attends Religious Services: Not on file  . Active Member of Clubs or Organizations: Not on file  . Attends Archivist Meetings: Not on file  . Marital Status: Not on file      Objective:  BP 124/76   Pulse 70   Temp (!) 96.6 F (35.9 C) (Temporal)   Resp  18   Ht 5\' 4"  (1.626 m)   Wt 168 lb 3.2 oz (76.3 kg)   SpO2 97%   BMI 28.87 kg/m  Gen: NAD, resting comfortably    Assessment/Plan:  AWV completed 1. Educated, counseled and referred based on above elements 2. Educated, counseled and referred as appropriate for preventative needs 3. Discussed and documented a written plan for preventiative services and screenings with personalized health advice- After Visit Summary was given to patient which included this plan   Status of chronic or acute concerns  See physical  Recommended follow up: Return in about 1 year (around 06/20/2021) for physical or sooner if needed as long as blood pressure <140/90 and check at least monthly. Future Appointments  Date Time Provider [place]  08/05/2020  8:00 AM [facility] PREVISIT RM 51 [facility]  08/08/2020  9:00 AM Cirigliano, Vito V, DO [facility]     Lab/Order associations:   ICD-10-CM   1. Preventative health care  G83.66 COMPLETE METABOLIC PANEL WITH GFR    Lipid panel    CBC with Differential/Platelet  2. Essential hypertension  Q94 COMPLETE METABOLIC PANEL WITH GFR    Lipid panel    CBC with Differential/Platelet  3. Hyperlipidemia, unspecified hyperlipidemia type  T65.4 COMPLETE METABOLIC PANEL WITH GFR    Lipid panel    CBC with Differential/Platelet  4. Osteopenia of neck of left femur  M85.852    Return precautions advised.  Garret Reddish, MD

## 2020-06-21 LAB — CBC WITH DIFFERENTIAL/PLATELET
Absolute Monocytes: 375 cells/uL (ref 200–950)
Basophils Absolute: 101 cells/uL (ref 0–200)
Basophils Relative: 1.8 %
Eosinophils Absolute: 112 cells/uL (ref 15–500)
Eosinophils Relative: 2 %
HCT: 43.2 % (ref 35.0–45.0)
Hemoglobin: 14.3 g/dL (ref 11.7–15.5)
Lymphs Abs: 1501 cells/uL (ref 850–3900)
MCH: 29.6 pg (ref 27.0–33.0)
MCHC: 33.1 g/dL (ref 32.0–36.0)
MCV: 89.4 fL (ref 80.0–100.0)
MPV: 10.3 fL (ref 7.5–12.5)
Monocytes Relative: 6.7 %
Neutro Abs: 3511 cells/uL (ref 1500–7800)
Neutrophils Relative %: 62.7 %
Platelets: 283 10*3/uL (ref 140–400)
RBC: 4.83 10*6/uL (ref 3.80–5.10)
RDW: 12.5 % (ref 11.0–15.0)
Total Lymphocyte: 26.8 %
WBC: 5.6 10*3/uL (ref 3.8–10.8)

## 2020-06-21 LAB — COMPLETE METABOLIC PANEL WITH GFR
AG Ratio: 1.9 (calc) (ref 1.0–2.5)
ALT: 23 U/L (ref 6–29)
AST: 21 U/L (ref 10–35)
Albumin: 4.5 g/dL (ref 3.6–5.1)
Alkaline phosphatase (APISO): 64 U/L (ref 37–153)
BUN: 18 mg/dL (ref 7–25)
CO2: 29 mmol/L (ref 20–32)
Calcium: 10.8 mg/dL — ABNORMAL HIGH (ref 8.6–10.4)
Chloride: 103 mmol/L (ref 98–110)
Creat: 0.75 mg/dL (ref 0.60–0.93)
GFR, Est African American: 93 mL/min/{1.73_m2} (ref >=60)
GFR, Est Non African American: 80 mL/min/{1.73_m2} (ref >=60)
Globulin: 2.4 g/dL (calc) (ref 1.9–3.7)
Glucose, Bld: 101 mg/dL — ABNORMAL HIGH (ref 65–99)
Potassium: 4.2 mmol/L (ref 3.5–5.3)
Sodium: 140 mmol/L (ref 135–146)
Total Bilirubin: 0.6 mg/dL (ref 0.2–1.2)
Total Protein: 6.9 g/dL (ref 6.1–8.1)

## 2020-06-21 LAB — LIPID PANEL
Cholesterol: 235 mg/dL — ABNORMAL HIGH (ref <200)
HDL: 80 mg/dL (ref >=50)
LDL Cholesterol (Calc): 139 mg/dL (calc) — ABNORMAL HIGH
Non-HDL Cholesterol (Calc): 155 mg/dL (calc) — ABNORMAL HIGH (ref <130)
Total CHOL/HDL Ratio: 2.9 (calc) (ref <5.0)
Triglycerides: 69 mg/dL (ref <150)

## 2020-06-24 ENCOUNTER — Other Ambulatory Visit: Payer: Self-pay

## 2020-07-04 ENCOUNTER — Other Ambulatory Visit: Payer: Self-pay | Admitting: Family Medicine

## 2020-07-09 DIAGNOSIS — Z0100 Encounter for examination of eyes and vision without abnormal findings: Secondary | ICD-10-CM | POA: Diagnosis not present

## 2020-07-09 DIAGNOSIS — H5203 Hypermetropia, bilateral: Secondary | ICD-10-CM | POA: Diagnosis not present

## 2020-08-05 ENCOUNTER — Other Ambulatory Visit: Payer: Self-pay

## 2020-08-05 ENCOUNTER — Encounter: Payer: Self-pay | Admitting: Gastroenterology

## 2020-08-05 ENCOUNTER — Ambulatory Visit (AMBULATORY_SURGERY_CENTER): Payer: Self-pay

## 2020-08-05 VITALS — Ht 64.0 in | Wt 170.0 lb

## 2020-08-05 DIAGNOSIS — Z1211 Encounter for screening for malignant neoplasm of colon: Secondary | ICD-10-CM

## 2020-08-05 NOTE — Progress Notes (Signed)
No egg or soy allergy known to patient  No issues with past sedation with any surgeries or procedures No intubation problems in the past  No FH of Malignant Hyperthermia No diet pills per patient No home 02 use per patient  No blood thinners per patient  Pt denies issues with constipation  No A fib or A flutter  EMMI video via [place] 19 guidelines implemented in PV today with Pt and RN  COVID vaccines completed on 10/2019 per pt;  Due to the COVID-19 pandemic we are asking patients to follow these guidelines. Please only bring one care partner. Please be aware that your care partner may wait in the car in the parking lot or if they feel like they will be too hot to wait in the car, they may wait in the lobby on the 4th floor. All care partners are required to wear a mask the entire time (we do not have any that we can provide them), they need to practice social distancing, and we will do a Covid check for all patient's and care partners when you arrive. Also we will check their temperature and your temperature. If the care partner waits in their car they need to stay in the parking lot the entire time and we will call them on their cell phone when the patient is ready for discharge so they can bring the car to the front of the building. Also all patient's will need to wear a mask into building.

## 2020-08-08 ENCOUNTER — Ambulatory Visit (AMBULATORY_SURGERY_CENTER): Payer: Medicare HMO | Admitting: Gastroenterology

## 2020-08-08 ENCOUNTER — Encounter: Payer: Self-pay | Admitting: Gastroenterology

## 2020-08-08 ENCOUNTER — Other Ambulatory Visit: Payer: Self-pay

## 2020-08-08 VITALS — BP 108/60 | HR 57 | Temp 97.3°F | Resp 12 | Ht 64.0 in | Wt 170.0 lb

## 2020-08-08 DIAGNOSIS — Z1211 Encounter for screening for malignant neoplasm of colon: Secondary | ICD-10-CM

## 2020-08-08 DIAGNOSIS — D125 Benign neoplasm of sigmoid colon: Secondary | ICD-10-CM

## 2020-08-08 DIAGNOSIS — D120 Benign neoplasm of cecum: Secondary | ICD-10-CM

## 2020-08-08 DIAGNOSIS — D122 Benign neoplasm of ascending colon: Secondary | ICD-10-CM

## 2020-08-08 DIAGNOSIS — E669 Obesity, unspecified: Secondary | ICD-10-CM | POA: Diagnosis not present

## 2020-08-08 DIAGNOSIS — E785 Hyperlipidemia, unspecified: Secondary | ICD-10-CM | POA: Diagnosis not present

## 2020-08-08 DIAGNOSIS — K6289 Other specified diseases of anus and rectum: Secondary | ICD-10-CM

## 2020-08-08 DIAGNOSIS — I10 Essential (primary) hypertension: Secondary | ICD-10-CM | POA: Diagnosis not present

## 2020-08-08 MED ORDER — SODIUM CHLORIDE 0.9 % IV SOLN: 500.0000 mL | Freq: Once | INTRAVENOUS | Status: DC

## 2020-08-08 NOTE — Patient Instructions (Signed)
Handout given for polyps.  YOU HAD AN ENDOSCOPIC PROCEDURE TODAY AT THE [facility]:   Refer to the procedure report that was given to you for any specific questions about what was found during the examination.  If the procedure report does not answer your questions, please call your gastroenterologist to clarify.  If you requested that your care partner not be given the details of your procedure findings, then the procedure report has been included in a sealed envelope for you to review at your convenience later.  YOU SHOULD EXPECT: Some feelings of bloating in the abdomen. Passage of more gas than usual.  Walking can help get rid of the air that was put into your GI tract during the procedure and reduce the bloating. If you had a lower endoscopy (such as a colonoscopy or flexible sigmoidoscopy) you may notice spotting of blood in your stool or on the toilet paper. If you underwent a bowel prep for your procedure, you may not have a normal bowel movement for a few days.  Please Note:  You might notice some irritation and congestion in your nose or some drainage.  This is from the oxygen used during your procedure.  There is no need for concern and it should clear up in a day or so.  SYMPTOMS TO REPORT IMMEDIATELY:   Following lower endoscopy (colonoscopy or flexible sigmoidoscopy):  Excessive amounts of blood in the stool  Significant tenderness or worsening of abdominal pains  Swelling of the abdomen that is new, acute  Fever of 100F or higher  For urgent or emergent issues, a gastroenterologist can be reached at any hour by calling [phone]. Do not use MyChart messaging for urgent concerns.    DIET:  We do recommend a small meal at first, but then you may proceed to your regular diet.  Drink plenty of fluids but you should avoid alcoholic beverages for 24 hours.  ACTIVITY:  You should plan to take it easy for the rest of today and you should NOT DRIVE or use heavy  machinery until tomorrow (because of the sedation medicines used during the test).    FOLLOW UP: Our staff will call the number listed on your records 48-72 hours following your procedure to check on you and address any questions or concerns that you may have regarding the information given to you following your procedure. If we do not reach you, we will leave a message.  We will attempt to reach you two times.  During this call, we will ask if you have developed any symptoms of COVID 19. If you develop any symptoms (ie: fever, flu-like symptoms, shortness of breath, cough etc.) before then, please call [phone].  If you test positive for Covid 19 in the 2 weeks post procedure, please call and report this information to us.    If any biopsies were taken you will be contacted by phone or by letter within the next 1-3 weeks.  Please call us at [phone] if you have not heard about the biopsies in 3 weeks.    SIGNATURES/CONFIDENTIALITY: You and/or your care partner have signed paperwork which will be entered into your electronic medical record.  These signatures attest to the fact that that the information above on your After Visit Summary has been reviewed and is understood.  Full responsibility of the confidentiality of this discharge information lies with you and/or your care-partner. 

## 2020-08-08 NOTE — Op Note (Signed)
[place] Patient Name: Toni Mcgrath Procedure Date: 08/08/2020 8:56 AM MRN: [id number] Endoscopist: Gerrit Heck , MD Age: 69 Referring MD:  Date of Birth: 03-31-1951 Gender: Female Account #: [id number] Procedure:                Colonoscopy Indications:              Screening for colorectal malignant neoplasm (last                            colonoscopy was 10 years ago)                           Last colonoscopy in 10/2009 was normal. Otherwise,                            no active GI symptoms and no known FHx of colon                            cancer. Medicines:                Monitored Anesthesia Care Procedure:                Pre-Anesthesia Assessment:                           - Prior to the procedure, a History and Physical                            was performed, and patient medications and                            allergies were reviewed. The patient's tolerance of                            previous anesthesia was also reviewed. The risks                            and benefits of the procedure and the sedation                            options and risks were discussed with the patient.                            All questions were answered, and informed consent                            was obtained. Prior Anticoagulants: The patient has                            taken no previous anticoagulant or antiplatelet                            agents. ASA Grade Assessment: II - A patient with  mild systemic disease. After reviewing the risks                            and benefits, the patient was deemed in                            satisfactory condition to undergo the procedure.                           After obtaining informed consent, the colonoscope                            was passed under direct vision. Throughout the                            procedure, the patient's blood pressure, pulse, and                             oxygen saturations were monitored continuously. The                            Colonoscope was introduced through the anus and                            advanced to the the cecum, identified by                            appendiceal orifice and ileocecal valve. The                            colonoscopy was performed without difficulty. The                            patient tolerated the procedure well. The quality                            of the bowel preparation was good. The ileocecal                            valve, appendiceal orifice, and rectum were                            photographed. Scope In: 9:02:15 AM Scope Out: 9:26:59 AM Scope Withdrawal Time: 0 hours 18 minutes 24 seconds  Total Procedure Duration: 0 hours 24 minutes 44 seconds  Findings:                 The perianal and digital rectal examinations were                            normal.                           Five sessile polyps were found in the sigmoid colon                            (  1), ascending colon (1), and cecum (3). The polyps                            were 2 to 5 mm in size. These polyps were removed                            with a cold snare. Resection and retrieval were                            complete. Estimated blood loss was minimal.                           Anal papilla(e) were hypertrophied. Complications:            No immediate complications. Estimated Blood Loss:     Estimated blood loss was minimal. Impression:               - Five 2 to 5 mm polyps in the sigmoid colon, in                            the ascending colon and in the cecum, removed with                            a cold snare. Resected and retrieved.                           - Anal papilla(e) were hypertrophied. Recommendation:           - Patient has a contact number available for                            emergencies. The signs and symptoms of potential                            delayed complications were  discussed with the                            patient. Return to normal activities tomorrow.                            Written discharge instructions were provided to the                            patient.                           - Resume previous diet.                           - Continue present medications.                           - Await pathology results.                           - Repeat colonoscopy  for surveillance based on                            pathology results.                           - Return to GI office PRN. Gerrit Heck, MD 08/08/2020 9:31:43 AM

## 2020-08-08 NOTE — Progress Notes (Signed)
Called to room to assist during endoscopic procedure.  Patient ID and intended procedure confirmed with present staff. Received instructions for my participation in the procedure from the performing physician.  

## 2020-08-08 NOTE — Progress Notes (Signed)
Report to PACU, RN, vss, BBS= Clear.  

## 2020-08-08 NOTE — Progress Notes (Signed)
Pt's states no medical or surgical changes since previsit or office visit.  VS SP

## 2020-08-12 ENCOUNTER — Telehealth: Payer: Self-pay | Admitting: *Deleted

## 2020-08-12 NOTE — Telephone Encounter (Signed)
1. Have you developed a fever since your procedure? no  2.   Have you had an respiratory symptoms (SOB or cough) since your procedure? no  3.   Have you tested positive for COVID 19 since your procedure no  4.   Have you had any family members/close contacts diagnosed with the COVID 19 since your procedure?  no   If yes to any of these questions please route to Joylene John, RN and Joella Prince, RN Follow up Call-  Call back number 08/08/2020  Post procedure Call Back phone  # [phone]  Permission to leave phone message Yes  Some recent data might be hidden     Patient questions:  Do you have a fever, pain , or abdominal swelling? No. Pain Score  0 *  Have you tolerated food without any problems? Yes.    Have you been able to return to your normal activities? Yes.    Do you have any questions about your discharge instructions: Diet   No. Medications  No. Follow up visit  No.  Do you have questions or concerns about your Care? No.  Actions: * If pain score is 4 or above: No action needed, pain <4.

## 2020-08-14 ENCOUNTER — Encounter: Payer: Self-pay | Admitting: Gastroenterology

## 2020-08-14 DIAGNOSIS — R0781 Pleurodynia: Secondary | ICD-10-CM | POA: Diagnosis not present

## 2020-08-29 ENCOUNTER — Other Ambulatory Visit: Payer: Self-pay | Admitting: Family Medicine

## 2020-10-04 DIAGNOSIS — M25572 Pain in left ankle and joints of left foot: Secondary | ICD-10-CM | POA: Diagnosis not present

## 2020-10-04 DIAGNOSIS — S82302A Unspecified fracture of lower end of left tibia, initial encounter for closed fracture: Secondary | ICD-10-CM | POA: Diagnosis not present

## 2020-10-23 DIAGNOSIS — S82302A Unspecified fracture of lower end of left tibia, initial encounter for closed fracture: Secondary | ICD-10-CM | POA: Diagnosis not present

## 2020-10-23 DIAGNOSIS — M25572 Pain in left ankle and joints of left foot: Secondary | ICD-10-CM | POA: Diagnosis not present

## 2020-11-15 DIAGNOSIS — S82302A Unspecified fracture of lower end of left tibia, initial encounter for closed fracture: Secondary | ICD-10-CM | POA: Diagnosis not present

## 2020-12-13 DIAGNOSIS — S82302A Unspecified fracture of lower end of left tibia, initial encounter for closed fracture: Secondary | ICD-10-CM | POA: Diagnosis not present

## 2020-12-23 DIAGNOSIS — S82302A Unspecified fracture of lower end of left tibia, initial encounter for closed fracture: Secondary | ICD-10-CM | POA: Diagnosis not present

## 2021-01-03 ENCOUNTER — Other Ambulatory Visit: Payer: Self-pay | Admitting: Family Medicine

## 2021-01-03 DIAGNOSIS — Z1231 Encounter for screening mammogram for malignant neoplasm of breast: Secondary | ICD-10-CM

## 2021-01-10 DIAGNOSIS — S82302A Unspecified fracture of lower end of left tibia, initial encounter for closed fracture: Secondary | ICD-10-CM | POA: Diagnosis not present

## 2021-02-25 ENCOUNTER — Ambulatory Visit: Payer: Medicare HMO

## 2021-03-21 ENCOUNTER — Other Ambulatory Visit: Payer: Self-pay

## 2021-03-21 ENCOUNTER — Ambulatory Visit
Admission: RE | Admit: 2021-03-21 | Discharge: 2021-03-21 | Disposition: A | Discharge status: Home / Self Care | Payer: Medicare HMO | Source: Ambulatory Visit | Attending: Family Medicine | Admitting: Family Medicine

## 2021-03-21 DIAGNOSIS — Z1231 Encounter for screening mammogram for malignant neoplasm of breast: Secondary | ICD-10-CM

## 2021-04-30 DIAGNOSIS — L905 Scar conditions and fibrosis of skin: Secondary | ICD-10-CM | POA: Diagnosis not present

## 2021-04-30 DIAGNOSIS — Z85828 Personal history of other malignant neoplasm of skin: Secondary | ICD-10-CM | POA: Diagnosis not present

## 2021-04-30 DIAGNOSIS — D229 Melanocytic nevi, unspecified: Secondary | ICD-10-CM | POA: Diagnosis not present

## 2021-04-30 DIAGNOSIS — L720 Epidermal cyst: Secondary | ICD-10-CM | POA: Diagnosis not present

## 2021-04-30 DIAGNOSIS — L821 Other seborrheic keratosis: Secondary | ICD-10-CM | POA: Diagnosis not present

## 2021-06-12 ENCOUNTER — Telehealth: Payer: Self-pay | Admitting: Family Medicine

## 2021-06-12 NOTE — Telephone Encounter (Signed)
Copied from [place] [phone]. Topic: Medicare AWV >> Jun 12, 2021 11:46 AM Harris-Coley, Hannah Beat wrote: Reason for CRM: Left message for patient to schedule Annual Wellness Visit.  Please schedule with Nurse Health Advisor Charlott Rakes, RN at [facility].  Please call [phone] ask for [facility]

## 2021-06-18 NOTE — Progress Notes (Signed)
Phone [phone]   Subjective:  Patient presents today for their annual physical. Chief complaint-noted.   See problem oriented charting- ROS- full  review of systems was completed and negative per full ROS sheet  The following were reviewed and entered/updated in epic: Past Medical History:  Diagnosis Date   Allergy    seasonal allergies   Hyperlipidemia    on meds   Hypertension    on meds   Patient Active Problem List   Diagnosis Date Noted   Essential hypertension 03/18/2015    Priority: 2.   Hyperlipidemia 07/12/2008    Priority: 2.   Osteopenia 01/04/2015    Priority: 3.   Rash 01/04/2015    Priority: 3.   History of skin cancer 05/30/2018   Past Surgical History:  Procedure Laterality Date   APPENDECTOMY     along with other surgery   OVARIAN CYST SURGERY     in law school in in [place]     hysterectomy, still gets paps with obgyn   WISDOM TOOTH EXTRACTION      Family History  Problem Relation Age of Onset   Stroke Mother 73       stroke   Heart disease Father 68       sudden death while sleeping, no autopsy. prior smoker   Colon polyps Neg Hx    Colon cancer Neg Hx    Esophageal cancer Neg Hx    Rectal cancer Neg Hx    Stomach cancer Neg Hx     Medications- reviewed and updated Current Outpatient Medications  Medication Sig Dispense Refill   Ascorbic Acid (VITAMIN C PO) Take 1 tablet by mouth daily.     aspirin 81 MG tablet Take 81 mg by mouth daily.       atorvastatin (LIPITOR) 20 MG tablet TAKE 1 TABLET(20 MG) BY MOUTH 1 TIME A WEEK 14 tablet 3   B Complex-Biotin-FA (SUPER B-100) TABS Take 1 tablet by mouth daily.       Calcium Carbonate-Vitamin D (CALCIUM-VITAMIN D) 600-125 MG-UNIT TABS Take 1 tablet by mouth daily.      Cholecalciferol (VITAMIN D3) 1000 UNITS tablet Take 1,000 Units by mouth daily.       diclofenac sodium (VOLTAREN) 1 % GEL Apply topically  to affected area qid (Patient taking differently: as needed.) 100 g 1   Flaxseed, Linseed, (FLAX SEED OIL) 1000 MG CAPS Take 1,400 mg by mouth daily.      hydrochlorothiazide (HYDRODIURIL) 25 MG tablet TAKE 1/2 TABLET BY MOUTH EVERY DAY 45 tablet 4   Multiple Vitamin (MULTIVITAMIN) capsule Take 1 capsule by mouth daily.       Omega-3 Fatty Acids (FISH OIL) 500 MG CAPS Take 1,200 mg by mouth daily.      triamcinolone cream (KENALOG) 0.5 % Apply 1 application topically 2 (two) times daily as needed. rash 30 g 1   ZINC OXIDE PO Take 1 tablet by mouth daily.     No current facility-administered medications for this visit.    Allergies-reviewed and updated No Known Allergies  Social History   Social History Narrative   Married (husband patient [name] of Dr. Yong Channel), no children, no pets.       Works as an Forensic psychologist- IT consultant      Hobbies: walking, working in yard   Objective  Objective:  BP 136/84 Comment: retake in office.  Pulse 71   Temp  98.6 F (37 C) (Temporal)   Ht 5\' 4"  (1.626 m)   Wt 170 lb 6.4 oz (77.3 kg)   SpO2 98%   BMI 29.25 kg/m  Gen: NAD, resting comfortably HEENT: Mucous membranes are moist. Oropharynx normal Neck: no thyromegaly CV: RRR no murmurs rubs or gallops Lungs: CTAB no crackles, wheeze, rhonchi Abdomen: soft/nontender/nondistended/normal bowel sounds. No rebound or guarding.  Ext: no edema Skin: warm, dry Neuro: grossly normal, moves all extremities, PERRLA   Assessment and Plan   70 y.o. female presenting for annual physical.  Health Maintenance counseling: 1. Anticipatory guidance: Patient counseled regarding regular dental exams s- she goes every 4 months, eye exams - yearly typically, avoiding smoking and second hand smoke , limiting alcohol to 1 beverage per day .  No illicit drugs.  2. Risk factor reduction:  Advised patient of need for regular exercise and diet rich and fruits and vegetables to reduce risk of heart attack and  stroke. Exercise- goal 150 minutes a weekat least-had setback last year after knee injury then fractures x 2 this year- plans to work on this.   Diet-last year wanted to cut down on her cheese and salt.  Was to try to improve on getting fruit intake.  Today reports-actually had dropped 5 lbs but later regained. Has same struggles this year.  Wt Readings from Last 3 Encounters:  06/25/21 170 lb 6.4 oz (77.3 kg)  08/08/20 170 lb (77.1 kg)  08/05/20 170 lb (77.1 kg)  3. Immunizations/screenings/ancillary studies DISCUSSED:  -Flu vaccination (last one 10/21) - today - discussed bivalent vaccination - had to wait at least 2 months Immunization History  Administered Date(s) Administered   Fluad Quad(high Dose 65+) 06/13/2019, 06/20/2020, 06/25/2021   Influenza Split 06/06/2012   Influenza, High Dose Seasonal PF 07/01/2017, 06/22/2018   Influenza,inj,Quad PF,6+ Mos 06/07/2013, 06/04/2014, 05/30/2015   PFIZER Comirnaty(Gray Top)Covid-19 Tri-Sucrose Vaccine 03/29/2021   PFIZER(Purple Top)SARS-COV-2 Vaccination 09/27/2019, 10/18/2019, 06/04/2020   Pneumococcal Conjugate-13 06/04/2014   Pneumococcal Polysaccharide-23 05/30/2015   Td 09/08/2003   Tdap 06/11/2016   Unspecified SARS-COV-2 Vaccination 09/27/2019, 10/18/2019   Zoster Recombinat (Shingrix) 12/28/2020, 03/22/2021   Zoster, Live 02/03/2011  4. Cervical cancer screening-still follows with GYN every 2 years- passed age based screening recommendations  5. Breast cancer screening-   breast exam with GYN  and mammogram 03/21/2021  6. Colon cancer screening - colonoscopy 08/08/2020- 3 year repeat 7. Skin cancer screening-  skin surgery center yearly-typically August. advised regular sunscreen use. Denies worrisome, changing, or new skin lesions.  8. Birth control/STD check- postmenopausal/monogamous  9. Osteoporosis screening at 65-DEXA 05/25/18 -see discussion below -Never smoker   Status of chronic or acute concerns   #social update- [place]  office merged- nearly doubled her cases.   #Fractured a rib/ left ankle- managed by Dr. Rhona Raider - fractured a rib after stepping off ladder awkwardly and then falling over- rib healed up but found out had ankle fracture as well on the left.  Ankle healed as well without intervention- sounds like tibia fracture (states large bone)  #hypertension S: medication: Hydrochlorothiazide 12.5 mg every day Home readings #s: <130 at home BP Readings from Last 3 Encounters:  06/25/21 136/84  08/08/20 108/60  06/20/20 124/76  A/P:  Controlled. Continue current medications.     #hyperlipidemia S: Medication:Atorvastatin 20 mg once a week and fish oil Had recommended considered every other day on coronary artery calcium scoring last physical -noted myalgias when starting statin but stabilized- nervous about increasing dose as  result -prefers to take asa still- no history GI bleed Lab Results  Component Value Date   CHOL 235 (H) 06/20/2020   HDL 80 06/20/2020   LDLCALC 139 (H) 06/20/2020   LDLDIRECT 160.5 06/01/2013   TRIG 69 06/20/2020   CHOLHDL 2.9 06/20/2020   A/P: Mild improvement on once weekly statin-has wanted to avoid increasing strength unless absolutely necessary-I discussed coronary artery calcium last year-patient reports would lean toward coronary artery calcium    # Low Bone density (formerly osteopenia) S: Last DEXA: September 2019 worst T score -2.1 at right femur.  FRAX elevation 3.6 for hip fracture-she desired to hold off on Fosamax   Calcium: 1200mg  (through diet ok to contribute) recommended  Vitamin D: 1000 units a day recommended    Weightbearing exercise- plans to restart after set back from fractures earlier this year A/P: hopefully stable- update bone density   #Knee osteoarthritis-Voltaren gel as needed in past- not needing lately - did use it for trigger thumb and helped eventually    #Seasonal allergies-reasonable this year- not taking anything for it    #Rash  on ears-triamcinolone as needed. Uses periodically. Also using a t fal product for maintenance advised by derm  Recommended follow OL:MBEMLJ in about 1 year (around 06/25/2022) for physical or sooner if needed.  Lab/Order associations: Not fasting   ICD-10-CM   1. Preventative health care  Z00.00     2. Hyperlipidemia, unspecified hyperlipidemia type  E78.5 CBC with Differential/Platelet    Comprehensive metabolic panel    Lipid panel    3. Essential hypertension  I10     4. Osteopenia of neck of left femur  M85.852 DG Bone Density    5. Need for immunization against influenza  Z23 Flu Vaccine QUAD High Dose(Fluad)     No orders of the defined types were placed in this encounter.  I,Jada Bradford,acting as a scribe for Garret Reddish, MD.,have documented all relevant documentation on the behalf of Garret Reddish, MD,as directed by  Garret Reddish, MD while in the presence of Garret Reddish, MD.   I, Garret Reddish, MD, have reviewed all documentation for this visit. The documentation on 06/25/21 for the exam, diagnosis, procedures, and orders are all accurate and complete.   Return precautions advised.  Garret Reddish, MD

## 2021-06-25 ENCOUNTER — Encounter: Payer: Self-pay | Admitting: Family Medicine

## 2021-06-25 ENCOUNTER — Other Ambulatory Visit: Payer: Self-pay

## 2021-06-25 ENCOUNTER — Ambulatory Visit (INDEPENDENT_AMBULATORY_CARE_PROVIDER_SITE_OTHER): Payer: Medicare HMO | Admitting: Family Medicine

## 2021-06-25 VITALS — BP 136/84 | HR 71 | Temp 98.6°F | Ht 64.0 in | Wt 170.4 lb

## 2021-06-25 DIAGNOSIS — I10 Essential (primary) hypertension: Secondary | ICD-10-CM

## 2021-06-25 DIAGNOSIS — Z23 Encounter for immunization: Secondary | ICD-10-CM | POA: Diagnosis not present

## 2021-06-25 DIAGNOSIS — E785 Hyperlipidemia, unspecified: Secondary | ICD-10-CM | POA: Diagnosis not present

## 2021-06-25 DIAGNOSIS — M85852 Other specified disorders of bone density and structure, left thigh: Secondary | ICD-10-CM

## 2021-06-25 DIAGNOSIS — Z0000 Encounter for general adult medical examination without abnormal findings: Secondary | ICD-10-CM | POA: Diagnosis not present

## 2021-06-25 LAB — LIPID PANEL
Cholesterol: 230 mg/dL — ABNORMAL HIGH (ref 0–200)
HDL: 80.7 mg/dL (ref >39.00)
LDL Cholesterol: 138 mg/dL — ABNORMAL HIGH (ref 0–99)
NonHDL: 149.34
Total CHOL/HDL Ratio: 3
Triglycerides: 59 mg/dL (ref 0.0–149.0)
VLDL: 11.8 mg/dL (ref 0.0–40.0)

## 2021-06-25 LAB — CBC WITH DIFFERENTIAL/PLATELET
Basophils Absolute: 0.1 10*3/uL (ref 0.0–0.1)
Basophils Relative: 1.2 % (ref 0.0–3.0)
Eosinophils Absolute: 0.1 10*3/uL (ref 0.0–0.7)
Eosinophils Relative: 1.6 % (ref 0.0–5.0)
HCT: 42 % (ref 36.0–46.0)
Hemoglobin: 13.8 g/dL (ref 12.0–15.0)
Lymphocytes Relative: 22.6 % (ref 12.0–46.0)
Lymphs Abs: 1.2 10*3/uL (ref 0.7–4.0)
MCHC: 32.8 g/dL (ref 30.0–36.0)
MCV: 89.9 fl (ref 78.0–100.0)
Monocytes Absolute: 0.4 10*3/uL (ref 0.1–1.0)
Monocytes Relative: 7.2 % (ref 3.0–12.0)
Neutro Abs: 3.5 10*3/uL (ref 1.4–7.7)
Neutrophils Relative %: 67.4 % (ref 43.0–77.0)
Platelets: 281 10*3/uL (ref 150.0–400.0)
RBC: 4.68 Mil/uL (ref 3.87–5.11)
RDW: 13.8 % (ref 11.5–15.5)
WBC: 5.2 10*3/uL (ref 4.0–10.5)

## 2021-06-25 LAB — COMPREHENSIVE METABOLIC PANEL
ALT: 20 U/L (ref 0–35)
AST: 24 U/L (ref 0–37)
Albumin: 4.4 g/dL (ref 3.5–5.2)
Alkaline Phosphatase: 59 U/L (ref 39–117)
BUN: 20 mg/dL (ref 6–23)
CO2: 27 mEq/L (ref 19–32)
Calcium: 10.2 mg/dL (ref 8.4–10.5)
Chloride: 102 mEq/L (ref 96–112)
Creatinine, Ser: 0.77 mg/dL (ref 0.40–1.20)
GFR: 77.25 mL/min (ref >60.00)
Glucose, Bld: 98 mg/dL (ref 70–99)
Potassium: 3.9 mEq/L (ref 3.5–5.1)
Sodium: 138 mEq/L (ref 135–145)
Total Bilirubin: 0.5 mg/dL (ref 0.2–1.2)
Total Protein: 7 g/dL (ref 6.0–8.3)

## 2021-06-25 MED ORDER — TRIAMCINOLONE ACETONIDE 0.5 % EX CREA: 1.0000 "application " | TOPICAL_CREAM | Freq: Two times a day (BID) | CUTANEOUS | 1 refills | Status: AC | PRN

## 2021-06-25 NOTE — Patient Instructions (Addendum)
Health Maintenance Due  Topic Date Due   INFLUENZA VACCINE - High dose shot done today in office.  04/07/2021   Please consider new bivalent shot later this year.   Please consider the half-of-plate method with dietary intake.   Please schedule your bone density test at check out desk.    Please stop by lab before you go If you have mychart- we will send your results within 3 business days of [place] receiving them.  If you do not have mychart- we will call you about results within 5 business days of [place] receiving them.  *please also note that you will see labs on mychart as soon as they post. I will later go in and write notes on them- will say "notes from Dr. Yong Channel"   Recommended follow up: Return in about 1 year (around 06/25/2022) for physical or sooner if needed. -as long as home blood pressure <135/85 and you check at least monthly

## 2021-06-26 ENCOUNTER — Ambulatory Visit (INDEPENDENT_AMBULATORY_CARE_PROVIDER_SITE_OTHER)
Admission: RE | Admit: 2021-06-26 | Discharge: 2021-06-26 | Disposition: A | Discharge status: Home / Self Care | Payer: Medicare HMO | Source: Ambulatory Visit | Attending: Family Medicine | Admitting: Family Medicine

## 2021-06-26 DIAGNOSIS — M85852 Other specified disorders of bone density and structure, left thigh: Secondary | ICD-10-CM | POA: Diagnosis not present

## 2021-08-06 ENCOUNTER — Telehealth: Payer: Self-pay | Admitting: Family Medicine

## 2021-08-06 NOTE — Telephone Encounter (Signed)
Copied from [place] [phone]. Topic: Medicare AWV >> Aug 06, 2021 10:12 AM Harris-Coley, Hannah Beat wrote: Reason for CRM: Left message for patient to schedule Annual Wellness Visit.  Please schedule with Nurse Health Advisor Charlott Rakes, RN at [facility].  Please call [phone] ask for [facility]

## 2021-08-06 NOTE — Telephone Encounter (Signed)
Copied from [place] [phone]. Topic: Medicare AWV >> Aug 06, 2021 10:12 AM Harris-Coley, Hannah Beat wrote: Reason for CRM: Left message for patient to schedule Annual Wellness Visit.  Please schedule with Nurse Health Advisor Charlott Rakes, RN at [facility].  Please call [phone] ask for [facility]

## 2021-08-27 ENCOUNTER — Other Ambulatory Visit: Payer: Self-pay | Admitting: Family Medicine

## 2021-09-03 ENCOUNTER — Other Ambulatory Visit: Payer: Self-pay | Admitting: Family Medicine

## 2021-10-23 ENCOUNTER — Telehealth: Payer: Self-pay | Admitting: Family Medicine

## 2021-10-23 NOTE — Telephone Encounter (Signed)
Copied from [place] [phone]. Topic: Medicare AWV >> Oct 23, 2021 10:55 AM Harris-Coley, Hannah Beat wrote: Reason for CRM: Left message for patient to schedule Annual Wellness Visit.  Please schedule with Nurse Health Advisor Charlott Rakes, RN at [facility].  Please call [phone] ask for [facility]

## 2022-04-24 ENCOUNTER — Other Ambulatory Visit: Payer: Self-pay | Admitting: Family Medicine

## 2022-04-24 DIAGNOSIS — Z1231 Encounter for screening mammogram for malignant neoplasm of breast: Secondary | ICD-10-CM

## 2022-05-06 DIAGNOSIS — D2262 Melanocytic nevi of left upper limb, including shoulder: Secondary | ICD-10-CM | POA: Diagnosis not present

## 2022-05-06 DIAGNOSIS — D225 Melanocytic nevi of trunk: Secondary | ICD-10-CM | POA: Diagnosis not present

## 2022-05-06 DIAGNOSIS — Z85828 Personal history of other malignant neoplasm of skin: Secondary | ICD-10-CM | POA: Diagnosis not present

## 2022-05-06 DIAGNOSIS — L400 Psoriasis vulgaris: Secondary | ICD-10-CM | POA: Diagnosis not present

## 2022-05-06 DIAGNOSIS — D2261 Melanocytic nevi of right upper limb, including shoulder: Secondary | ICD-10-CM | POA: Diagnosis not present

## 2022-05-06 DIAGNOSIS — R229 Localized swelling, mass and lump, unspecified: Secondary | ICD-10-CM | POA: Diagnosis not present

## 2022-05-06 DIAGNOSIS — Z08 Encounter for follow-up examination after completed treatment for malignant neoplasm: Secondary | ICD-10-CM | POA: Diagnosis not present

## 2022-05-06 DIAGNOSIS — C44722 Squamous cell carcinoma of skin of right lower limb, including hip: Secondary | ICD-10-CM | POA: Diagnosis not present

## 2022-05-08 ENCOUNTER — Ambulatory Visit
Admission: RE | Admit: 2022-05-08 | Discharge: 2022-05-08 | Disposition: A | Discharge status: Home / Self Care | Payer: Medicare HMO | Source: Ambulatory Visit | Attending: Family Medicine | Admitting: Family Medicine

## 2022-05-08 DIAGNOSIS — Z1231 Encounter for screening mammogram for malignant neoplasm of breast: Secondary | ICD-10-CM

## 2022-06-01 ENCOUNTER — Encounter: Payer: Self-pay | Admitting: *Deleted

## 2022-06-18 ENCOUNTER — Ambulatory Visit (INDEPENDENT_AMBULATORY_CARE_PROVIDER_SITE_OTHER): Payer: Medicare HMO

## 2022-06-18 DIAGNOSIS — Z23 Encounter for immunization: Secondary | ICD-10-CM | POA: Diagnosis not present

## 2022-06-22 ENCOUNTER — Telehealth: Payer: Self-pay | Admitting: Family Medicine

## 2022-06-22 NOTE — Telephone Encounter (Signed)
Copied from [place] [phone]. Topic: Medicare AWV >> Jun 22, 2022 10:32 AM Devoria Glassing wrote: Reason for CRM: Left message for patient to schedule Annual Wellness Visit.  Please schedule with Nurse Health Advisor Charlott Rakes, RN at [facility]. This appt can be telephone or office visit. Please call [phone] ask for [facility]

## 2022-07-01 ENCOUNTER — Ambulatory Visit (INDEPENDENT_AMBULATORY_CARE_PROVIDER_SITE_OTHER): Payer: Medicare HMO | Admitting: Family Medicine

## 2022-07-01 ENCOUNTER — Encounter: Payer: Self-pay | Admitting: Family Medicine

## 2022-07-01 VITALS — BP 132/78 | HR 75 | Temp 98.0°F | Resp 16 | Ht 64.0 in | Wt 167.8 lb

## 2022-07-01 DIAGNOSIS — Z0000 Encounter for general adult medical examination without abnormal findings: Secondary | ICD-10-CM | POA: Diagnosis not present

## 2022-07-01 DIAGNOSIS — E785 Hyperlipidemia, unspecified: Secondary | ICD-10-CM

## 2022-07-01 LAB — COMPREHENSIVE METABOLIC PANEL
ALT: 20 U/L (ref 0–35)
AST: 23 U/L (ref 0–37)
Albumin: 4.4 g/dL (ref 3.5–5.2)
Alkaline Phosphatase: 55 U/L (ref 39–117)
BUN: 17 mg/dL (ref 6–23)
CO2: 30 mEq/L (ref 19–32)
Calcium: 10.8 mg/dL — ABNORMAL HIGH (ref 8.4–10.5)
Chloride: 102 mEq/L (ref 96–112)
Creatinine, Ser: 0.76 mg/dL (ref 0.40–1.20)
GFR: 77.92 mL/min (ref >60.00)
Glucose, Bld: 103 mg/dL — ABNORMAL HIGH (ref 70–99)
Potassium: 3.8 mEq/L (ref 3.5–5.1)
Sodium: 139 mEq/L (ref 135–145)
Total Bilirubin: 0.5 mg/dL (ref 0.2–1.2)
Total Protein: 7.1 g/dL (ref 6.0–8.3)

## 2022-07-01 LAB — LIPID PANEL
Cholesterol: 225 mg/dL — ABNORMAL HIGH (ref 0–200)
HDL: 77.9 mg/dL (ref >39.00)
LDL Cholesterol: 132 mg/dL — ABNORMAL HIGH (ref 0–99)
NonHDL: 146.77
Total CHOL/HDL Ratio: 3
Triglycerides: 75 mg/dL (ref 0.0–149.0)
VLDL: 15 mg/dL (ref 0.0–40.0)

## 2022-07-01 LAB — CBC WITH DIFFERENTIAL/PLATELET
Basophils Absolute: 0.1 10*3/uL (ref 0.0–0.1)
Basophils Relative: 1.1 % (ref 0.0–3.0)
Eosinophils Absolute: 0.1 10*3/uL (ref 0.0–0.7)
Eosinophils Relative: 1.5 % (ref 0.0–5.0)
HCT: 43.1 % (ref 36.0–46.0)
Hemoglobin: 14.3 g/dL (ref 12.0–15.0)
Lymphocytes Relative: 25.7 % (ref 12.0–46.0)
Lymphs Abs: 1.4 10*3/uL (ref 0.7–4.0)
MCHC: 33.1 g/dL (ref 30.0–36.0)
MCV: 89.1 fl (ref 78.0–100.0)
Monocytes Absolute: 0.4 10*3/uL (ref 0.1–1.0)
Monocytes Relative: 8 % (ref 3.0–12.0)
Neutro Abs: 3.4 10*3/uL (ref 1.4–7.7)
Neutrophils Relative %: 63.7 % (ref 43.0–77.0)
Platelets: 282 10*3/uL (ref 150.0–400.0)
RBC: 4.84 Mil/uL (ref 3.87–5.11)
RDW: 13.6 % (ref 11.5–15.5)
WBC: 5.3 10*3/uL (ref 4.0–10.5)

## 2022-07-01 NOTE — Patient Instructions (Addendum)
Health Maintenance Due  Topic Date Due   Medicare Annual Wellness (AWV)  07/20/2021  You are eligible to schedule your annual wellness visit with our nurse specialist Otila Kluver.  Please consider scheduling this before you leave today -consider at 6 month mark to check in on weight, blood pressure and walking with bone density  Please stop by lab before you go If you have mychart- we will send your results within 3 business days of [place] receiving them.  If you do not have mychart- we will call you about results within 5 business days of [place] receiving them.  *please also note that you will see labs on mychart as soon as they post. I will later go in and write notes on them- will say "notes from Dr. Yong Channel"   Recommended follow up: Return in about 1 year (around 07/02/2023) for physical or sooner if needed.Schedule b4 you leave.

## 2022-07-01 NOTE — Progress Notes (Signed)
Phone [phone]   Subjective:  Patient presents today for their annual physical. Chief complaint-noted.   See problem oriented charting- ROS- full  review of systems was completed and negative except for: constipation, left arm numbness  The following were reviewed and entered/updated in epic: Past Medical History:  Diagnosis Date   Allergy    seasonal allergies   Hyperlipidemia    on meds   Hypertension    on meds   Patient Active Problem List   Diagnosis Date Noted   Essential hypertension 03/18/2015    Priority: Medium    Hyperlipidemia 07/12/2008    Priority: Medium    Osteopenia 01/04/2015    Priority: Low   Rash 01/04/2015    Priority: Low   History of skin cancer 05/30/2018   Past Surgical History:  Procedure Laterality Date   APPENDECTOMY     along with other surgery   OVARIAN CYST SURGERY     in law school in in [place]     hysterectomy, still gets paps with obgyn   WISDOM TOOTH EXTRACTION      Family History  Problem Relation Age of Onset   Stroke Mother 55       stroke   Heart disease Father 75       sudden death while sleeping, no autopsy. prior smoker   Colon polyps Neg Hx    Colon cancer Neg Hx    Esophageal cancer Neg Hx    Rectal cancer Neg Hx    Stomach cancer Neg Hx     Medications- reviewed and updated Current Outpatient Medications  Medication Sig Dispense Refill   Ascorbic Acid (VITAMIN C PO) Take 1 tablet by mouth daily.     aspirin 81 MG tablet Take 81 mg by mouth daily.       atorvastatin (LIPITOR) 20 MG tablet TAKE 1 TABLET(20 MG) BY MOUTH 1 TIME A WEEK 12 tablet 4   B Complex-Biotin-FA (SUPER B-100) TABS Take 1 tablet by mouth daily.       Calcium Carbonate-Vitamin D (CALCIUM-VITAMIN D) 600-125 MG-UNIT TABS Take 1 tablet by mouth daily.      Cholecalciferol (VITAMIN D3) 1000 UNITS tablet Take 1,000 Units by mouth daily.       diclofenac sodium  (VOLTAREN) 1 % GEL Apply topically to affected area qid (Patient taking differently: as needed.) 100 g 1   Flaxseed, Linseed, (FLAX SEED OIL) 1000 MG CAPS Take 1,400 mg by mouth daily.      hydrochlorothiazide (HYDRODIURIL) 25 MG tablet TAKE 1/2 TABLET BY MOUTH EVERY DAY 45 tablet 4   Multiple Vitamin (MULTIVITAMIN) capsule Take 1 capsule by mouth daily.       Omega-3 Fatty Acids (FISH OIL) 500 MG CAPS Take 1,200 mg by mouth daily.      ZINC OXIDE PO Take 1 tablet by mouth daily.     triamcinolone cream (KENALOG) 0.5 % Apply 1 application topically 2 (two) times daily as needed. Rash for 7 days (Patient not taking: Reported on 07/01/2022) 30 g 1   No current facility-administered medications for this visit.    Allergies-reviewed and updated No Known Allergies  Social History   Social History Narrative   Married (husband patient [name] of Dr. Yong Channel), no children, no pets.       Works as an Forensic psychologist- IT consultant      Hobbies: walking, working in yard   Objective  Objective:  BP 132/78   Pulse 75   Temp 98 F (36.7 C) (Temporal)   Resp 16   Ht '5\' 4"'$  (1.626 m)   Wt 167 lb 12.8 oz (76.1 kg)   SpO2 98%   BMI 28.80 kg/m  Gen: NAD, resting comfortably HEENT: Mucous membranes are moist. Oropharynx normal Neck: no thyromegaly CV: RRR no murmurs rubs or gallops Lungs: CTAB no crackles, wheeze, rhonchi Abdomen: soft/nontender/nondistended/normal bowel sounds. No rebound or guarding.  Ext: no edema Skin: warm, dry Neuro: grossly normal, moves all extremities, PERRLA   Assessment and Plan   71 y.o. female presenting for annual physical.  Health Maintenance counseling: 1. Anticipatory guidance: Patient counseled regarding regular dental exams -q4 months, eye exams - yearly typically,  avoiding smoking and second hand smoke , limiting alcohol to 1 beverage per day , no illicit drugs .   2. Risk factor reduction:  Advised patient of need for regular exercise and diet rich and  fruits and vegetables to reduce risk of heart attack and stroke.  Exercise- has been low lately- audit ends nov 10- wants to restart walking at this point- is going to at least try Saturday or sunday.  Diet/weight management-down 3 lbs from last year despite busy year with work- feels could still improve diet.  Wt Readings from Last 3 Encounters:  07/01/22 167 lb 12.8 oz (76.1 kg)  06/25/21 170 lb 6.4 oz (77.3 kg)  08/08/20 170 lb (77.1 kg)  3. Immunizations/screenings/ancillary studies- flu shot this year, covid shot - consider at pharmacy, prevnar 20- hold off , rsv - hold off for now  Immunization History  Administered Date(s) Administered   Fluad Quad(high Dose 65+) 06/13/2019, 06/20/2020, 06/25/2021, 06/18/2022   Influenza Split 06/06/2012   Influenza, High Dose Seasonal PF 07/01/2017, 06/22/2018   Influenza,inj,Quad PF,6+ Mos 06/07/2013, 06/04/2014, 05/30/2015   PFIZER Comirnaty(Gray Top)Covid-19 Tri-Sucrose Vaccine 03/29/2021   PFIZER(Purple Top)SARS-COV-2 Vaccination 09/27/2019, 10/18/2019, 06/04/2020   Pfizer Covid-19 Vaccine Bivalent Booster 66yr & up 08/18/2021   Pneumococcal Conjugate-13 06/04/2014   Pneumococcal Polysaccharide-23 05/30/2015   Td 09/08/2003   Tdap 06/11/2016   Unspecified SARS-COV-2 Vaccination 09/27/2019, 10/18/2019   Zoster Recombinat (Shingrix) 12/28/2020, 03/22/2021   Zoster, Live 02/03/2011  4. Cervical cancer screening-still follows with GYN every 2 years- passed age based screening recommendations   5. Breast cancer screening-   breast exam with GYN  and mammogram 05/08/22  6. Colon cancer screening - colonoscopy 08/08/2020- 3 year repeat due to polyp history 7. Skin cancer screening-  skin surgery center yearly-typically August . advised regular sunscreen use. Denies worrisome, changing, or new skin lesions.  8. Birth control/STD check- postmenopausal/monogamous   9. Osteoporosis screening at 65-DEXA 06/26/21  -see discussion below -Never smoker     Status of chronic or acute concerns   #hypertension S: medication: Hydrochlorothiazide 12.5 mg Home readings #s: no recent check   BP Readings from Last 3 Encounters:  07/01/22 132/78  06/25/21 136/84  08/08/20 108/60   A/P:  Controlled. Continue current medications.    #hyperlipidemia S: Medication:Atorvastatin 20 mg once a week.  Had recommended considering every other day -Also prefers aspirin 81 mg for primary prevention  Lab Results  Component Value Date   CHOL 230 (H) 06/25/2021   HDL 80.70 06/25/2021   LDLCALC 138 (H) 06/25/2021   LDLDIRECT 160.5 06/01/2013   TRIG 59.0 06/25/2021   CHOLHDL 3 06/25/2021   A/P: update lipid panel- considered pushing for LDL under 108 or so for primary prevention vs getting  more info for ct calcium scoring- will get #s and she will think this over -mom with stroke mid 27s, dad with heart disease early 33s  # Low Bone density (formerly osteopenia) S: Last DEXA: 06/26/21 down 4% in hips and up slightly in lumbar spine. Prior September 2019 worst T score -2.1 at right femur.  FRAX elevation for hip and total fracture risk-  offered fosamax  Calcium: '1200mg'$  (through diet ok) recommended - yes at least 600 through diet Vitamin D: 1000 units a day recommended- yes  Weightbearing exercise-not recently  A/P: she prefers to work on weight bearing exercise more aggressively and continue calcium and vitamin D and recheck 2 years   #Knee osteoarthritis-Voltaren gel as needed- not needing lately  #Constipation- periodic. Activia works after 3-4 days   Numbness    Left arm, wakes up to this sensation States that she did strain her shoulder a couple weeks ago  Only happens when sleeps on the right side- doesn't happen if sleeps on left side Does not take muich time for it to wake up-  can shake out quickly -no daytime issues or if takes a nap. No chest pain or shortness of breath and no issues with exertion such as stairs -improving- better in last  2 days- did not happen -if worsens she will let us know   #Wakes up twice a night- usually 2 hours after bed no matter what time she goes to bed and then 4 or 4 30- wonders if there is a noise in the neighborhood- trains do come through at night 10 blocks away. Can fall back asleep  Recommended follow up: Return in about 1 year (around 07/02/2023) for physical or sooner if needed.Schedule b4 you leave.  Lab/Order associations: Not fasting   ICD-10-CM   1. Preventative health care  Z00.00     2. Hyperlipidemia, unspecified hyperlipidemia type  E78.5       No orders of the defined types were placed in this encounter.   Return precautions advised.  Garret Reddish, MD

## 2022-07-02 ENCOUNTER — Other Ambulatory Visit: Payer: Self-pay | Admitting: Family Medicine

## 2022-08-05 ENCOUNTER — Telehealth: Payer: Self-pay | Admitting: Family Medicine

## 2022-08-05 NOTE — Telephone Encounter (Signed)
Copied from [place] [phone]. Topic: Medicare AWV >> Aug 05, 2022  2:09 PM Gillis Santa wrote: Reason for CRM: LVM FOR PATIENT TO CB SCHEDULE AWV [place] TINA--KAREN @ [phone]

## 2022-08-13 DIAGNOSIS — Z1272 Encounter for screening for malignant neoplasm of vagina: Secondary | ICD-10-CM | POA: Diagnosis not present

## 2022-08-13 DIAGNOSIS — Z01419 Encounter for gynecological examination (general) (routine) without abnormal findings: Secondary | ICD-10-CM | POA: Diagnosis not present

## 2022-08-18 ENCOUNTER — Telehealth: Payer: Self-pay | Admitting: Family Medicine

## 2022-08-18 NOTE — Telephone Encounter (Signed)
Copied from [place] [phone]. Topic: Medicare AWV >> Aug 18, 2022  9:54 AM Gillis Santa wrote: Reason for CRM: LVM PATIENT TO CALL [phone] SCHEDULE AWVS WITH TINA

## 2022-08-25 ENCOUNTER — Telehealth: Payer: Self-pay | Admitting: Family Medicine

## 2022-08-25 NOTE — Telephone Encounter (Signed)
Copied from [place] [phone]. Topic: Medicare AWV >> Aug 25, 2022 11:27 AM Gillis Santa wrote: Reason for CRM: LVM PATIENT TO CALL BACK [place]

## 2022-08-28 ENCOUNTER — Other Ambulatory Visit: Payer: Self-pay | Admitting: Family Medicine

## 2022-11-03 ENCOUNTER — Telehealth: Payer: Self-pay | Admitting: Family Medicine

## 2022-11-03 NOTE — Telephone Encounter (Signed)
Called patient to schedule Medicare Annual Wellness Visit (AWV). Left message for patient to call back and schedule Medicare Annual Wellness Visit (AWV).  Last date of AWV: 06/20/2020  Please schedule an appointment at any time with Otila Kluver, [facility].  If any questions, please contact me at [phone].  Thank you ,  Shaune Pollack [facility] AWV TEAM Direct Dial [phone]

## 2022-11-25 ENCOUNTER — Telehealth: Payer: Self-pay | Admitting: Family Medicine

## 2022-11-25 NOTE — Telephone Encounter (Signed)
Copied from [place] [phone]. Topic: Medicare AWV >> Nov 25, 2022  9:36 AM Gillis Santa wrote: Reason for CRM: Called patient to schedule Medicare Annual Wellness Visit (AWV). Left message for patient to call back and schedule Medicare Annual Wellness Visit (AWV).  Last date of AWV: 06/20/2020  Please schedule an appointment at any time with Otila Kluver, [facility].  Please schedule AWVS with N[facility] Otila Kluver, [place].  If any questions, please contact me at [phone].  Thank you ,  Shaune Pollack [facility] AWV TEAM Direct Dial [phone]

## 2022-11-30 ENCOUNTER — Other Ambulatory Visit: Payer: Self-pay | Admitting: Family Medicine

## 2022-12-09 ENCOUNTER — Telehealth: Payer: Self-pay | Admitting: Family Medicine

## 2022-12-09 NOTE — Telephone Encounter (Signed)
Copied from [place] [phone]. Topic: Medicare AWV >> Dec 09, 2022 11:17 AM Gillis Santa wrote: Reason for CRM: Called patient to schedule Medicare Annual Wellness Visit (AWV). Left message for patient to call back and schedule Medicare Annual Wellness Visit (AWV).  Last date of AWV: 06/20/2020  Please schedule an appointment at any time with Otila Kluver, [facility]. Please schedule AWVS with Otila Kluver, [place]..  If any questions, please contact me at [phone].  Thank you ,  Shaune Pollack [facility] AWV TEAM Direct Dial [phone]

## 2023-01-18 ENCOUNTER — Telehealth: Payer: Self-pay | Admitting: Family Medicine

## 2023-01-18 NOTE — Telephone Encounter (Signed)
Copied from CRM [phone]. Topic: Medicare AWV >> Jan 18, 2023  9:05 AM Gwenith Spitz wrote: Reason for CRM: Called patient to schedule Medicare Annual Wellness Visit (AWV). Left message for patient to call back and schedule Medicare Annual Wellness Visit (AWV).  Last date of AWV: 06/20/2020  Please schedule an appointment at any time with Inetta Fermo, [facility]. Please schedule AWVS with Inetta Fermo, [facility] [place].  If any questions, please contact me at [phone].  Thank you ,  Gabriel Cirri [facility] AWV TEAM Direct Dial [phone]

## 2023-05-18 DIAGNOSIS — D2272 Melanocytic nevi of left lower limb, including hip: Secondary | ICD-10-CM | POA: Diagnosis not present

## 2023-05-18 DIAGNOSIS — L814 Other melanin hyperpigmentation: Secondary | ICD-10-CM | POA: Diagnosis not present

## 2023-05-18 DIAGNOSIS — Z85828 Personal history of other malignant neoplasm of skin: Secondary | ICD-10-CM | POA: Diagnosis not present

## 2023-05-18 DIAGNOSIS — D229 Melanocytic nevi, unspecified: Secondary | ICD-10-CM | POA: Diagnosis not present

## 2023-05-18 DIAGNOSIS — L821 Other seborrheic keratosis: Secondary | ICD-10-CM | POA: Diagnosis not present

## 2023-05-18 DIAGNOSIS — C44722 Squamous cell carcinoma of skin of right lower limb, including hip: Secondary | ICD-10-CM | POA: Diagnosis not present

## 2023-05-18 DIAGNOSIS — Z08 Encounter for follow-up examination after completed treatment for malignant neoplasm: Secondary | ICD-10-CM | POA: Diagnosis not present

## 2023-05-18 DIAGNOSIS — L820 Inflamed seborrheic keratosis: Secondary | ICD-10-CM | POA: Diagnosis not present

## 2023-05-18 DIAGNOSIS — R229 Localized swelling, mass and lump, unspecified: Secondary | ICD-10-CM | POA: Diagnosis not present

## 2023-05-18 DIAGNOSIS — L400 Psoriasis vulgaris: Secondary | ICD-10-CM | POA: Diagnosis not present

## 2023-07-07 ENCOUNTER — Encounter: Payer: Medicare HMO | Admitting: Family Medicine

## 2023-08-20 ENCOUNTER — Other Ambulatory Visit: Payer: Self-pay | Admitting: Obstetrics and Gynecology

## 2023-08-20 DIAGNOSIS — Z1231 Encounter for screening mammogram for malignant neoplasm of breast: Secondary | ICD-10-CM

## 2023-08-27 DIAGNOSIS — M1712 Unilateral primary osteoarthritis, left knee: Secondary | ICD-10-CM | POA: Diagnosis not present

## 2023-09-10 ENCOUNTER — Encounter: Payer: Self-pay | Admitting: Family Medicine

## 2023-09-10 ENCOUNTER — Ambulatory Visit (INDEPENDENT_AMBULATORY_CARE_PROVIDER_SITE_OTHER): Payer: Medicare HMO | Admitting: Family Medicine

## 2023-09-10 VITALS — BP 136/86 | HR 82 | Temp 97.2°F | Ht 64.0 in | Wt 161.2 lb

## 2023-09-10 DIAGNOSIS — I10 Essential (primary) hypertension: Secondary | ICD-10-CM

## 2023-09-10 DIAGNOSIS — E785 Hyperlipidemia, unspecified: Secondary | ICD-10-CM | POA: Diagnosis not present

## 2023-09-10 DIAGNOSIS — Z0000 Encounter for general adult medical examination without abnormal findings: Secondary | ICD-10-CM | POA: Diagnosis not present

## 2023-09-10 DIAGNOSIS — Z1211 Encounter for screening for malignant neoplasm of colon: Secondary | ICD-10-CM | POA: Diagnosis not present

## 2023-09-10 DIAGNOSIS — M85852 Other specified disorders of bone density and structure, left thigh: Secondary | ICD-10-CM | POA: Diagnosis not present

## 2023-09-10 NOTE — Patient Instructions (Addendum)
 You are eligible to schedule your annual wellness visit with our nurse specialist Ell[place].  Please consider scheduling this before you leave today  On home checks average would prefer <135/85  [facility] contact Please call to schedule visit and/or procedure Address: [address] [place], [place], [place] [address] Phone: [phone]   Schedule your bone density test at check out desk.  - located [address] across the street from [place] - in the basement - you DO NEED an appointment for the bone density tests.   Please stop by lab before you go If you have mychart- we will send your results within 3 business days of us  receiving them.  If you do not have mychart- we will call you about results within 5 business days of us  receiving them.  *please also note that you will see labs on mychart as soon as they post. I will later go in and write notes on them- will say notes from Dr. Katrinka   Recommended follow up: Return in about 1 year (around 09/09/2024) for physical or sooner if needed.Schedule b4 you leave. -happy to see you in 6 months if needed for blood pressure checks

## 2023-09-10 NOTE — Progress Notes (Signed)
 Phone [phone]   Subjective:  Patient presents today for their annual physical. Chief complaint-noted.   See problem oriented charting- ROS- full  review of systems was completed and negative except for: left knee pain  The following were reviewed and entered/updated in epic: Past Medical History:  Diagnosis Date   Allergy    seasonal allergies   Hyperlipidemia    on meds   Hypertension    on meds   Patient Active Problem List   Diagnosis Date Noted   Essential hypertension 03/18/2015    Priority: Medium    Hyperlipidemia 07/12/2008    Priority: Medium    Osteopenia 01/04/2015    Priority: Low   Rash 01/04/2015    Priority: Low   History of skin cancer 05/30/2018   Past Surgical History:  Procedure Laterality Date   APPENDECTOMY     along with other surgery   OVARIAN CYST SURGERY     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 prior smoker   Colon polyps Neg Hx    Colon cancer Neg Hx    Esophageal cancer Neg Hx    Rectal cancer Neg Hx    Stomach cancer Neg Hx     Medications- reviewed and updated Current Outpatient Medications  Medication Sig Dispense Refill   Ascorbic Acid (VITAMIN C PO) Take 1 tablet by mouth daily.     aspirin 81 MG tablet Take 81 mg by mouth daily.       atorvastatin  (LIPITOR) 20 MG tablet TAKE 1 TABLET(20 MG) BY MOUTH 1 TIME A WEEK 12 tablet 4   B Complex-Biotin-FA (SUPER B-100) TABS Take 1 tablet by mouth daily.       Calcium  Carbonate-Vitamin D (CALCIUM -VITAMIN D) 600-125 MG-UNIT TABS Take 1 tablet by mouth daily.      Cholecalciferol (VITAMIN D3) 1000 UNITS tablet Take 1,000 Units by mouth daily.       diclofenac  sodium (VOLTAREN ) 1 %  GEL Apply topically to affected area qid (Patient taking differently: as needed.) 100 g 1   Flaxseed, Linseed, (FLAX SEED OIL) 1000 MG CAPS Take 1,400 mg by mouth daily.      hydrochlorothiazide  (HYDRODIURIL ) 25 MG tablet TAKE 1/2 TABLET BY MOUTH EVERY DAY 45 tablet 4   Multiple Vitamin (MULTIVITAMIN) capsule Take 1 capsule by mouth daily.       Omega-3 Fatty Acids (FISH OIL) 500 MG CAPS Take 1,200 mg by mouth daily.      triamcinolone  cream (KENALOG ) 0.5 % Apply 1 application topically 2 (two) times daily as needed. Rash for 7 days 30 g 1   ZINC OXIDE PO Take 1 tablet by mouth daily.     No current facility-administered medications for this visit.    Allergies-reviewed and updated No Known Allergies  Social History   Social History Narrative   Married (husband patient Garrel of Dr. Katrinka), no children, no pets.       Works as an pensions consultant- scientist, physiological      Hobbies: walking, working in yard   Objective  Objective:  BP 136/86   Pulse 82  Temp (!) 97.2 F (36.2 C) (Temporal)   Ht 5' 4 (1.626 m)   Wt 161 lb 3.2 oz (73.1 kg)   SpO2 96%   BMI 27.67 kg/m  Gen: NAD, resting comfortably HEENT: Mucous membranes are moist. Oropharynx normal Neck: no thyromegaly CV: RRR no murmurs rubs or gallops Lungs: CTAB no crackles, wheeze, rhonchi Abdomen: soft/nontender/nondistended/normal bowel sounds. No rebound or guarding.  Ext: no edema Skin: warm, dry Neuro: grossly normal, moves all extremities, PERRLA   Assessment and Plan   73 y.o. female presenting for annual physical.  Health Maintenance counseling: 1. Anticipatory guidance: Patient counseled regarding regular dental exams -q4 months, eye exams - yearly typically,  avoiding smoking and second hand smoke , limiting alcohol to 1 beverage per day- glass a day , no illicit drugs .   2. Risk factor reduction:  Advised patient of need for regular exercise and diet rich and fruits and vegetables to reduce risk of heart attack  and stroke.  Exercise- down due to her knee.  Diet/weight management-Down 6 pounds from last year- was down to 159 before Christmas- reduced portions some and some reduced alcohol  - in June had wanted to drop 5 lbs.  Wt Readings from Last 3 Encounters:  09/10/23 161 lb 3.2 oz (73.1 kg)  07/01/22 167 lb 12.8 oz (76.1 kg)  06/25/21 170 lb 6.4 oz (77.3 kg)  3. Immunizations/screenings/ancillary studies-holding off on COVID-19 vaccination, otherwise up-to-date  Immunization History  Administered Date(s) Administered   Fluad Quad(high Dose 65+) 06/13/2019, 06/20/2020, 06/25/2021, 06/18/2022   Influenza Split 06/06/2012   Influenza, High Dose Seasonal PF 07/01/2017, 06/22/2018, 07/31/2023   Influenza,inj,Quad PF,6+ Mos 06/07/2013, 06/04/2014, 05/30/2015   PFIZER Comirnaty(Gray Top)Covid-19 Tri-Sucrose Vaccine 03/29/2021   PFIZER(Purple Top)SARS-COV-2 Vaccination 09/27/2019, 10/18/2019, 06/04/2020   Pfizer Covid-19 Vaccine Bivalent Booster 39yrs & up 08/18/2021   Pneumococcal Conjugate-13 06/04/2014   Pneumococcal Polysaccharide-23 05/30/2015   Td 09/08/2003   Tdap 06/11/2016   Unspecified SARS-COV-2 Vaccination 09/27/2019, 10/18/2019   Zoster Recombinant(Shingrix) 12/28/2020, 03/22/2021   Zoster, Live 02/03/2011  4. Cervical cancer screening-still follows with GYN every 2 years- passed age based screening recommendations   5. Breast cancer screening-   breast exam with GYN  and mammogram 05/08/22 and scheduled January 8 6. Colon cancer screening - colonoscopy 08/08/2020- 3 year repeat due to polyp history  7. Skin cancer screening-   skin surgery center yearly-typically August . advised regular sunscreen use. Denies worrisome, changing, or new skin lesions.  8. Birth control/STD check- postmenopausal/monogamous   9. Osteoporosis screening at 65-DEXA 06/26/21  -see discussion below -Never smoker   Status of chronic or acute concerns   #hypertension S: medication: Hydrochlorothiazide  12.5  mg Home readings #s: no recent checks   BP Readings from Last 3 Encounters:  09/10/23 136/86  07/01/22 132/78  06/25/21 136/84   A/P: stable- continue current medicines - is going to monitor at home- prefer <135/85   #hyperlipidemia S: Medication:Atorvastatin  20 mg once a week.  Had recommended considering every other day -Also prefers aspirin 81 mg for primary prevention-mom with stroke in mid 80s and dad with heart disease in early 70s Lab Results  Component Value Date   CHOL 225 (H) 07/01/2022   HDL 77.90 07/01/2022   LDLCALC 132 (H) 07/01/2022   LDLDIRECT 160.5 06/01/2013   TRIG 75.0 07/01/2022   CHOLHDL 3 07/01/2022    A/P: check cholesterol- if stable or worse mentioned doing CT calcium  scoring- she is open to this   #  Low Bone density (formerly osteopenia) S: Last DEXA: 06/26/21 down 4% in hips and up slightly in lumbar spine. Prior September 2019 worst T score -2.1 at right femur.  FRAX elevation for hip and total fracture risk-  offered fosamax but she is prefers to continue to work on lifestyle- she was doing well with this until knee get in the way  Calcium : 1200mg  (through diet ok) recommended  Vitamin D: 1000 units a day recommended  Weightbearing exercise-limited as above  A/P: update bone density and reassess   #Left Knee osteoarthritis-Voltaren  gel as needed- tried shot recently with [facility] but didn't last too long- may try viscous supplementation   #Rash on ears-triamcinolone  as needed.  Head and shoulders was recommended by dermatology but she is not sure it was helpful.   Recommended follow up: Return in about 1 year (around 09/09/2024) for physical or sooner if needed.Schedule b4 you leave. Future Appointments  Date Time Provider Department Center  09/15/2023  5:10 PM [facility]   Lab/Order associations:NOT fasting -coffee with cream only   ICD-10-CM   1. Preventative health care  Z00.00     2. Essential hypertension  I10      3. Hyperlipidemia, unspecified hyperlipidemia type  E78.5 Comprehensive metabolic panel    CBC with Differential/Platelet    Lipid panel    4. Osteopenia of neck of left femur  M85.852 DG Bone Density    5. Screen for colon cancer  Z12.11 Ambulatory referral to Gastroenterology     No orders of the defined types were placed in this encounter.  Return precautions advised.  Garnette Lukes, MD

## 2023-09-11 LAB — COMPREHENSIVE METABOLIC PANEL
AG Ratio: 1.7 (calc) (ref 1.0–2.5)
ALT: 29 U/L (ref 6–29)
AST: 22 U/L (ref 10–35)
Albumin: 4.3 g/dL (ref 3.6–5.1)
Alkaline phosphatase (APISO): 68 U/L (ref 37–153)
BUN: 22 mg/dL (ref 7–25)
CO2: 23 mmol/L (ref 20–32)
Calcium: 10.3 mg/dL (ref 8.6–10.4)
Chloride: 100 mmol/L (ref 98–110)
Creat: 0.76 mg/dL (ref 0.60–1.00)
Globulin: 2.5 g/dL (ref 1.9–3.7)
Glucose, Bld: 89 mg/dL (ref 65–99)
Potassium: 3.8 mmol/L (ref 3.5–5.3)
Sodium: 137 mmol/L (ref 135–146)
Total Bilirubin: 0.6 mg/dL (ref 0.2–1.2)
Total Protein: 6.8 g/dL (ref 6.1–8.1)

## 2023-09-11 LAB — LIPID PANEL
Cholesterol: 262 mg/dL — ABNORMAL HIGH (ref <200)
HDL: 85 mg/dL (ref >=50)
LDL Cholesterol (Calc): 160 mg/dL — ABNORMAL HIGH
Non-HDL Cholesterol (Calc): 177 mg/dL — ABNORMAL HIGH (ref <130)
Total CHOL/HDL Ratio: 3.1 (calc) (ref <5.0)
Triglycerides: 71 mg/dL (ref <150)

## 2023-09-11 LAB — CBC WITH DIFFERENTIAL/PLATELET
Absolute Lymphocytes: 1686 {cells}/uL (ref 850–3900)
Absolute Monocytes: 431 {cells}/uL (ref 200–950)
Basophils Absolute: 69 {cells}/uL (ref 0–200)
Basophils Relative: 0.9 %
Eosinophils Absolute: 92 {cells}/uL (ref 15–500)
Eosinophils Relative: 1.2 %
HCT: 41.6 % (ref 35.0–45.0)
Hemoglobin: 13.9 g/dL (ref 11.7–15.5)
MCH: 29.8 pg (ref 27.0–33.0)
MCHC: 33.4 g/dL (ref 32.0–36.0)
MCV: 89.1 fL (ref 80.0–100.0)
MPV: 10.4 fL (ref 7.5–12.5)
Monocytes Relative: 5.6 %
Neutro Abs: 5421 {cells}/uL (ref 1500–7800)
Neutrophils Relative %: 70.4 %
Platelets: 285 10*3/uL (ref 140–400)
RBC: 4.67 10*6/uL (ref 3.80–5.10)
RDW: 13 % (ref 11.0–15.0)
Total Lymphocyte: 21.9 %
WBC: 7.7 10*3/uL (ref 3.8–10.8)

## 2023-09-13 ENCOUNTER — Other Ambulatory Visit: Payer: Self-pay | Admitting: Family Medicine

## 2023-09-13 DIAGNOSIS — E785 Hyperlipidemia, unspecified: Secondary | ICD-10-CM

## 2023-09-15 ENCOUNTER — Ambulatory Visit
Admission: RE | Admit: 2023-09-15 | Discharge: 2023-09-15 | Disposition: A | Discharge status: Home / Self Care | Payer: Medicare HMO | Source: Ambulatory Visit | Attending: Obstetrics and Gynecology | Admitting: Obstetrics and Gynecology

## 2023-09-15 DIAGNOSIS — Z1231 Encounter for screening mammogram for malignant neoplasm of breast: Secondary | ICD-10-CM

## 2023-09-16 ENCOUNTER — Ambulatory Visit (INDEPENDENT_AMBULATORY_CARE_PROVIDER_SITE_OTHER)
Admission: RE | Admit: 2023-09-16 | Discharge: 2023-09-16 | Disposition: A | Discharge status: Home / Self Care | Payer: Medicare HMO | Source: Ambulatory Visit

## 2023-09-16 DIAGNOSIS — M85852 Other specified disorders of bone density and structure, left thigh: Secondary | ICD-10-CM | POA: Diagnosis not present

## 2023-09-24 DIAGNOSIS — M1712 Unilateral primary osteoarthritis, left knee: Secondary | ICD-10-CM | POA: Diagnosis not present

## 2023-11-01 DIAGNOSIS — M1712 Unilateral primary osteoarthritis, left knee: Secondary | ICD-10-CM | POA: Diagnosis not present

## 2023-11-24 ENCOUNTER — Other Ambulatory Visit: Payer: Self-pay | Admitting: Family Medicine

## 2023-12-01 DIAGNOSIS — M1712 Unilateral primary osteoarthritis, left knee: Secondary | ICD-10-CM | POA: Diagnosis not present

## 2023-12-03 DIAGNOSIS — M1712 Unilateral primary osteoarthritis, left knee: Secondary | ICD-10-CM | POA: Diagnosis not present

## 2023-12-14 ENCOUNTER — Encounter: Payer: Self-pay | Admitting: Gastroenterology

## 2023-12-16 DIAGNOSIS — M1712 Unilateral primary osteoarthritis, left knee: Secondary | ICD-10-CM | POA: Diagnosis not present

## 2023-12-20 DIAGNOSIS — M1712 Unilateral primary osteoarthritis, left knee: Secondary | ICD-10-CM | POA: Diagnosis not present

## 2023-12-28 DIAGNOSIS — M1712 Unilateral primary osteoarthritis, left knee: Secondary | ICD-10-CM | POA: Diagnosis not present

## 2024-01-10 DIAGNOSIS — M1712 Unilateral primary osteoarthritis, left knee: Secondary | ICD-10-CM | POA: Diagnosis not present

## 2024-01-17 DIAGNOSIS — M1712 Unilateral primary osteoarthritis, left knee: Secondary | ICD-10-CM | POA: Diagnosis not present

## 2024-02-12 ENCOUNTER — Other Ambulatory Visit: Payer: Self-pay | Admitting: Family Medicine

## 2024-02-15 DIAGNOSIS — M1712 Unilateral primary osteoarthritis, left knee: Secondary | ICD-10-CM | POA: Diagnosis not present

## 2024-02-24 DIAGNOSIS — M1712 Unilateral primary osteoarthritis, left knee: Secondary | ICD-10-CM | POA: Diagnosis not present

## 2024-02-28 DIAGNOSIS — M1712 Unilateral primary osteoarthritis, left knee: Secondary | ICD-10-CM | POA: Diagnosis not present

## 2024-03-20 DIAGNOSIS — M1712 Unilateral primary osteoarthritis, left knee: Secondary | ICD-10-CM | POA: Diagnosis not present

## 2024-03-22 ENCOUNTER — Encounter: Payer: Self-pay | Admitting: Gastroenterology

## 2024-03-23 DIAGNOSIS — M1712 Unilateral primary osteoarthritis, left knee: Secondary | ICD-10-CM | POA: Diagnosis not present

## 2024-03-27 DIAGNOSIS — M1712 Unilateral primary osteoarthritis, left knee: Secondary | ICD-10-CM | POA: Diagnosis not present

## 2024-04-03 DIAGNOSIS — M1712 Unilateral primary osteoarthritis, left knee: Secondary | ICD-10-CM | POA: Diagnosis not present

## 2024-04-10 DIAGNOSIS — M1712 Unilateral primary osteoarthritis, left knee: Secondary | ICD-10-CM | POA: Diagnosis not present

## 2024-04-17 DIAGNOSIS — M1712 Unilateral primary osteoarthritis, left knee: Secondary | ICD-10-CM | POA: Diagnosis not present

## 2024-05-11 DIAGNOSIS — M1712 Unilateral primary osteoarthritis, left knee: Secondary | ICD-10-CM | POA: Diagnosis not present

## 2024-05-16 ENCOUNTER — Encounter

## 2024-05-18 DIAGNOSIS — M1712 Unilateral primary osteoarthritis, left knee: Secondary | ICD-10-CM | POA: Diagnosis not present

## 2024-05-23 DIAGNOSIS — M1712 Unilateral primary osteoarthritis, left knee: Secondary | ICD-10-CM | POA: Diagnosis not present

## 2024-05-30 ENCOUNTER — Encounter: Admitting: Gastroenterology

## 2024-06-01 DIAGNOSIS — M1712 Unilateral primary osteoarthritis, left knee: Secondary | ICD-10-CM | POA: Diagnosis not present

## 2024-06-20 DIAGNOSIS — M1712 Unilateral primary osteoarthritis, left knee: Secondary | ICD-10-CM | POA: Diagnosis not present

## 2024-07-06 DIAGNOSIS — M1712 Unilateral primary osteoarthritis, left knee: Secondary | ICD-10-CM | POA: Diagnosis not present

## 2024-07-20 DIAGNOSIS — M1712 Unilateral primary osteoarthritis, left knee: Secondary | ICD-10-CM | POA: Diagnosis not present

## 2024-07-26 DIAGNOSIS — M170 Bilateral primary osteoarthritis of knee: Secondary | ICD-10-CM | POA: Diagnosis not present

## 2024-07-26 DIAGNOSIS — G8929 Other chronic pain: Secondary | ICD-10-CM | POA: Diagnosis not present

## 2024-08-10 DIAGNOSIS — H43813 Vitreous degeneration, bilateral: Secondary | ICD-10-CM | POA: Diagnosis not present

## 2024-08-21 DIAGNOSIS — M1712 Unilateral primary osteoarthritis, left knee: Secondary | ICD-10-CM | POA: Diagnosis not present

## 2024-09-12 ENCOUNTER — Encounter: Payer: Self-pay | Admitting: Gastroenterology

## 2024-09-13 ENCOUNTER — Ambulatory Visit (INDEPENDENT_AMBULATORY_CARE_PROVIDER_SITE_OTHER): Payer: Medicare HMO | Admitting: Family Medicine

## 2024-09-13 ENCOUNTER — Encounter: Payer: Self-pay | Admitting: Family Medicine

## 2024-09-13 ENCOUNTER — Ambulatory Visit: Payer: Self-pay | Admitting: Family Medicine

## 2024-09-13 VITALS — BP 128/72 | HR 72 | Temp 97.7°F | Ht 64.0 in | Wt 166.4 lb

## 2024-09-13 DIAGNOSIS — Z131 Encounter for screening for diabetes mellitus: Secondary | ICD-10-CM | POA: Diagnosis not present

## 2024-09-13 DIAGNOSIS — E663 Overweight: Secondary | ICD-10-CM | POA: Diagnosis not present

## 2024-09-13 DIAGNOSIS — I10 Essential (primary) hypertension: Secondary | ICD-10-CM | POA: Diagnosis not present

## 2024-09-13 DIAGNOSIS — E785 Hyperlipidemia, unspecified: Secondary | ICD-10-CM | POA: Diagnosis not present

## 2024-09-13 DIAGNOSIS — Z0000 Encounter for general adult medical examination without abnormal findings: Secondary | ICD-10-CM | POA: Diagnosis not present

## 2024-09-13 LAB — CBC WITH DIFFERENTIAL/PLATELET
Basophils Absolute: 0.1 K/uL (ref 0.0–0.1)
Basophils Relative: 1.3 % (ref 0.0–3.0)
Eosinophils Absolute: 0.2 K/uL (ref 0.0–0.7)
Eosinophils Relative: 3.4 % (ref 0.0–5.0)
HCT: 42 % (ref 36.0–46.0)
Hemoglobin: 14.1 g/dL (ref 12.0–15.0)
Lymphocytes Relative: 23.4 % (ref 12.0–46.0)
Lymphs Abs: 1.4 K/uL (ref 0.7–4.0)
MCHC: 33.7 g/dL (ref 30.0–36.0)
MCV: 89.7 fl (ref 78.0–100.0)
Monocytes Absolute: 0.5 K/uL (ref 0.1–1.0)
Monocytes Relative: 9.1 % (ref 3.0–12.0)
Neutro Abs: 3.7 K/uL (ref 1.4–7.7)
Neutrophils Relative %: 62.8 % (ref 43.0–77.0)
Platelets: 311 K/uL (ref 150.0–400.0)
RBC: 4.68 Mil/uL (ref 3.87–5.11)
RDW: 14 % (ref 11.5–15.5)
WBC: 5.9 K/uL (ref 4.0–10.5)

## 2024-09-13 LAB — COMPREHENSIVE METABOLIC PANEL WITH GFR
ALT: 27 U/L (ref 3–35)
AST: 24 U/L (ref 5–37)
Albumin: 4.6 g/dL (ref 3.5–5.2)
Alkaline Phosphatase: 58 U/L (ref 39–117)
BUN: 25 mg/dL — ABNORMAL HIGH (ref 6–23)
CO2: 29 meq/L (ref 19–32)
Calcium: 10.5 mg/dL (ref 8.4–10.5)
Chloride: 102 meq/L (ref 96–112)
Creatinine, Ser: 0.81 mg/dL (ref 0.40–1.20)
GFR: 71.07 mL/min
Glucose, Bld: 97 mg/dL (ref 70–99)
Potassium: 3.8 meq/L (ref 3.5–5.1)
Sodium: 139 meq/L (ref 135–145)
Total Bilirubin: 0.6 mg/dL (ref 0.2–1.2)
Total Protein: 7.1 g/dL (ref 6.0–8.3)

## 2024-09-13 LAB — LIPID PANEL
Cholesterol: 248 mg/dL — ABNORMAL HIGH (ref 28–200)
HDL: 84.4 mg/dL
LDL Cholesterol: 145 mg/dL — ABNORMAL HIGH (ref 10–99)
NonHDL: 163.92
Total CHOL/HDL Ratio: 3
Triglycerides: 96 mg/dL (ref 10.0–149.0)
VLDL: 19.2 mg/dL (ref 0.0–40.0)

## 2024-09-13 LAB — HEMOGLOBIN A1C: Hgb A1c MFr Bld: 5.8 % (ref 4.6–6.5)

## 2024-09-13 NOTE — Progress Notes (Signed)
 " Phone [phone]   Subjective:  Patient presents today for their annual physical. Chief complaint-noted.   See problem oriented charting- ROS- full  review of systems was completed and negative except for topics noted under acute/chronic concerns   The following were reviewed and entered/updated in epic: Past Medical History:  Diagnosis Date   Allergy    seasonal allergies   Hyperlipidemia    on meds   Hypertension    on meds   Patient Active Problem List   Diagnosis Date Noted   Essential hypertension 03/18/2015    Priority: Medium    Hyperlipidemia 07/12/2008    Priority: Medium    Osteopenia 01/04/2015    Priority: Low   Rash 01/04/2015    Priority: Low   History of skin cancer 05/30/2018   Past Surgical History:  Procedure Laterality Date   ABDOMINAL HYSTERECTOMY     APPENDECTOMY     along with other surgery   OVARIAN CYST SURGERY     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 prior smoker   Colon polyps Neg Hx    Colon cancer Neg Hx    Esophageal cancer Neg Hx    Rectal cancer Neg Hx    Stomach cancer Neg Hx     Medications- reviewed and updated Current Outpatient Medications  Medication Sig Dispense Refill   Ascorbic Acid (VITAMIN C PO) Take 1 tablet by mouth daily.     aspirin 81 MG tablet Take 81 mg by mouth daily.       atorvastatin  (LIPITOR) 20 MG tablet TAKE 1 TABLET(20 MG) BY MOUTH 1 TIME A WEEK 12 tablet 4   B Complex-Biotin-FA (SUPER B-100) TABS Take 1 tablet by mouth daily.       Calcium  Carbonate-Vitamin D (CALCIUM -VITAMIN D) 600-125 MG-UNIT TABS Take 1 tablet by mouth daily.      Cholecalciferol (VITAMIN D3) 1000 UNITS tablet Take 1,000 Units  by mouth daily.       diclofenac  sodium (VOLTAREN ) 1 % GEL Apply topically to affected area qid (Patient taking differently: as needed.) 100 g 1   Flaxseed, Linseed, (FLAX SEED OIL) 1000 MG CAPS Take 1,400 mg by mouth daily.      hydrochlorothiazide  (HYDRODIURIL ) 25 MG tablet TAKE 1/2 TABLET BY MOUTH DAILY 45 tablet 4   Multiple Vitamin (MULTIVITAMIN) capsule Take 1 capsule by mouth daily.       Omega-3 Fatty Acids (FISH OIL) 500 MG CAPS Take 1,200 mg by mouth daily.      triamcinolone  cream (KENALOG ) 0.5 % Apply 1 application topically 2 (two) times daily as needed. Rash for 7 days 30 g 1   ZINC OXIDE PO Take 1 tablet by mouth daily.     No current facility-administered medications for this visit.    Allergies-reviewed and updated Allergies[1]  Social History   Social History Narrative   Married (husband patient Garrel of Dr. Katrinka), no children, no pets.       Works as an pensions consultant- scientist, physiological      Hobbies: walking, working in yard   Objective  Objective:  BP 128/72 (BP Location: Left Arm, Patient Position: Sitting, Cuff Size: Normal)   Pulse 72   Temp 97.7 F (36.5 C) (Temporal)   Ht 5' 4 (1.626 m)   Wt 166 lb 6.4 oz (75.5 kg)   SpO2 97%   BMI 28.56 kg/m  Gen: NAD, resting comfortably HEENT: Mucous membranes are moist. Oropharynx normal Neck: no thyromegaly CV: RRR no murmurs rubs or gallops Lungs: CTAB no crackles, wheeze, rhonchi Abdomen: soft/nontender/nondistended/normal bowel sounds. No rebound or guarding.  Ext: no edema Skin: warm, dry Neuro: grossly normal, moves all extremities, PERRLA   Assessment and Plan   74 y.o. female presenting for annual physical.  Health Maintenance counseling: 1. Anticipatory guidance: Patient counseled regarding regular dental exams -q4 months, eye exams - at least every few years- seen in december,  avoiding smoking and second hand smoke , limiting alcohol to 1 beverage per day-1 glass/day , no illicit drugs .   2.  Risk factor reduction:  Advised patient of need for regular exercise and diet rich and fruits and vegetables to reduce risk of heart attack and stroke.  Exercise-last year exercise was down due to her knee- still down and not walking as much- needs to add some level of weight bearing exercise.  Diet/weight management-weight up 5 pounds in the last year- walking and knee issues main barrier.  Wt Readings from Last 3 Encounters:  09/13/24 166 lb 6.4 oz (75.5 kg)  09/10/23 161 lb 3.2 oz (73.1 kg)  07/01/22 167 lb 12.8 oz (76.1 kg)  3. Immunizations/screenings/ancillary studies-opts out of COVID vaccine but otherwise up-to-date.  Immunization History  Administered Date(s) Administered   Fluad Quad(high Dose 65+) 06/13/2019, 06/20/2020, 06/25/2021, 06/18/2022   INFLUENZA, HIGH DOSE SEASONAL PF 07/01/2017, 06/22/2018, 07/31/2023, 06/10/2024   Influenza Split 06/06/2012   Influenza,inj,Quad PF,6+ Mos 06/07/2013, 06/04/2014, 05/30/2015   PFIZER Comirnaty(Gray Top)Covid-19 Tri-Sucrose Vaccine 03/29/2021   PFIZER(Purple Top)SARS-COV-2 Vaccination 09/27/2019, 10/18/2019, 06/04/2020   Pfizer Covid-19 Vaccine Bivalent Booster 38yrs & up 08/18/2021   Pneumococcal Conjugate-13 06/04/2014, 09/10/2023   Pneumococcal Polysaccharide-23 05/30/2015   Td 09/08/2003   Tdap 06/11/2016   Unspecified SARS-COV-2 Vaccination 09/27/2019, 10/18/2019   Zoster Recombinant(Shingrix) 12/28/2020, 03/22/2021   Zoster, Live 02/03/2011  4. Cervical cancer screening- past age based screening recommendations but still sees GYN every 2 years- plans to schedule 5. Breast cancer screening-  breast exam with GYN and mammogram 09/15/2023 and plans for annual with breast center- plans to schedule 6. Colon cancer screening - 08-2020 with polyp history but already scheduled for colonoscopy in February 7. Skin cancer screening-still sees skin surgery center yearly. advised regular sunscreen use. Denies worrisome, changing, or new skin  lesions.  8. Birth control/STD check- only active with husband and postmenopausal plus hysterectomy 9. Osteoporosis screening at 65-see below 10. Smoking associated screening -never smoker  Status of chronic or acute concerns   #periodic lower sternum pain /epigastric pain S: mainly happening if in bed and turning over- maybe twice a month. Started out about 2 months ago and more often but spacing out even more now 3-4 weeks. Mild uncomfortable feeling A/P: monitor for now but if worsening will return to care- wants to hodl off on evaluation for now. Doesn't sound like reflux not associated with meals anjd triggered by position change  #hypertension S: medication: Hydrochlorothiazide  12.5 mg BP Readings from Last 3 Encounters:  09/13/24 128/72  09/10/23 136/86  07/01/22 132/78  A/P: well controlled continue current medications    #hyperlipidemia S: Medication:Atorvastatin  20 mg  once a week.  Had recommended considering every other day -Also prefers aspirin 81 mg for primary prevention Lab Results  Component Value Date   CHOL 262 (H) 09/10/2023   HDL 85 09/10/2023   LDLCALC 160 (H) 09/10/2023   LDLDIRECT 160.5 06/01/2013   TRIG 71 09/10/2023   CHOLHDL 3.1 09/10/2023   A/P: lipids moderately elevated even on once weekly statin but with side effects profile on medicine prefers to keep lowest dose possible- we will check CT calcium  scoring to reevaluate risk and see if we need to push for higher dose   # Low Bone density (formerly osteopenia) S: Last DEXA: 09/16/2023 with worst T-score -2.2 at right femoral neck essentially stable and fracture risk mildly elevated above 20 and 3% threshold-previously declined Fosamax  Calcium : 1200mg  (through diet ok) recommended  Vitamin D: 1000 units a day recommended  Weightbearing exercise-down but encouraged  A/P: hopefully stable- update DEXA next year. Continue current meds for now    #Knee osteoarthritis-Voltaren  gel as needed -[facility]  Dr. Dalldorf-has required gel injections and physical therapyin 2025 and 26- she was very busy over holidays and this has been a set back- back to twice a week physical therapy   #Rash on ears-triamcinolone  as needed - ongoing issue.  Head and shoulders was recommended by dermatology but she is not sure it was helpful. Tar helps some. Sparing intermittent steroid  Recommended follow up: Return in about 1 year (around 09/13/2025) for physical or sooner if needed.Schedule b4 you leave. Future Appointments  Date Time Provider Department Center  09/28/2024  4:00 PM [facility] PRE[facility] RM 51 [facility]  10/13/2024  7:30 AM Cirigliano, Vito V, DO [facility]   Lab/Order associations:NOT fasting- sausage balls and creamer   ICD-10-CM   1. Preventative health care  Z00.00     2. Hyperlipidemia, unspecified hyperlipidemia type  E78.5     3. Essential hypertension  I10     4. Screening for diabetes mellitus  Z13.1     5. Overweight  E66.3       No orders of the defined types were placed in this encounter.   Return precautions advised.  Garnette Lukes, MD      [1] No Known Allergies  "

## 2024-09-13 NOTE — Patient Instructions (Addendum)
 We will call you within two weeks about your referral for CT calcium  through [facility]  Their phone number is [phone].  Please call them if you have not heard in 1-2 weeks  Please stop by lab before you go If you have mychart- we will send your results within 3 business days of us  receiving them.  If you do not have mychart- we will call you about results within 5 business days of us  receiving them.  *please also note that you will see labs on mychart as soon as they post. I will later go in and write notes on them- will say notes from Dr. Katrinka   Recommended follow up: Return in about 1 year (around 09/13/2025) for physical or sooner if needed.Schedule b4 you leave.

## 2024-09-19 ENCOUNTER — Inpatient Hospital Stay: Admission: RE | Admit: 2024-09-19 | Discharge: 2024-09-19 | Attending: Family Medicine | Admitting: Family Medicine

## 2024-09-19 DIAGNOSIS — E785 Hyperlipidemia, unspecified: Secondary | ICD-10-CM

## 2024-09-21 ENCOUNTER — Other Ambulatory Visit

## 2024-09-28 ENCOUNTER — Ambulatory Visit

## 2024-09-28 VITALS — Ht 64.0 in | Wt 165.0 lb

## 2024-09-28 DIAGNOSIS — Z860100 Personal history of colon polyps, unspecified: Secondary | ICD-10-CM

## 2024-09-28 NOTE — Progress Notes (Signed)

## 2024-10-03 ENCOUNTER — Encounter: Payer: Self-pay | Admitting: Gastroenterology

## 2024-10-13 ENCOUNTER — Ambulatory Visit: Admitting: Gastroenterology

## 2024-10-13 ENCOUNTER — Encounter: Payer: Self-pay | Admitting: Gastroenterology

## 2024-10-13 VITALS — BP 125/80 | HR 60 | Temp 97.2°F | Resp 13 | Ht 64.0 in | Wt 164.0 lb

## 2024-10-13 DIAGNOSIS — Z860100 Personal history of colon polyps, unspecified: Secondary | ICD-10-CM

## 2024-10-13 DIAGNOSIS — D128 Benign neoplasm of rectum: Secondary | ICD-10-CM

## 2024-10-13 DIAGNOSIS — D120 Benign neoplasm of cecum: Secondary | ICD-10-CM

## 2024-10-13 DIAGNOSIS — D124 Benign neoplasm of descending colon: Secondary | ICD-10-CM

## 2024-10-13 MED ORDER — SODIUM CHLORIDE 0.9 % IV SOLN: 500.0000 mL | INTRAVENOUS | Status: DC

## 2024-10-13 NOTE — Progress Notes (Signed)
 Pt's states no medical or surgical changes since previsit or office visit.

## 2024-10-13 NOTE — Op Note (Signed)
 [facility] Patient Name: Toni Mcgrath Call Procedure Date: 10/13/2024 8:32 AM MRN: [id number] Endoscopist: Sandor Flatter , MD, [id number] Age: 74 Referring MD:  Date of Birth: [birth year] Gender: Female Account #: [id number] Procedure:                Colonoscopy Indications:              Surveillance: Personal history of adenomatous                            polyps on last colonoscopy > 3 years ago                           Last colonoscopy was 08/2020 and notable for 5                            small 2-5 millimeter polyps (adenoma x 4,                            hyperplastic polyp x 1) with recommendation to                            repeat in 3 years for ongoing polyp surveillance. Medicines:                Monitored Anesthesia Care Procedure:                Pre-Anesthesia Assessment:                           - Prior to the procedure, a History and Physical                            was performed, and patient medications and                            allergies were reviewed. The patient's tolerance of                            previous anesthesia was also reviewed. The risks                            and benefits of the procedure and the sedation                            options and risks were discussed with the patient.                            All questions were answered, and informed consent                            was obtained. Prior Anticoagulants: The patient has                            taken no anticoagulant or antiplatelet agents. ASA  Grade Assessment: II - A patient with mild systemic                            disease. After reviewing the risks and benefits,                            the patient was deemed in satisfactory condition to                            undergo the procedure.                           After obtaining informed consent, the colonoscope                            was passed under direct vision.  Throughout the                            procedure, the patient's blood pressure, pulse, and                            oxygen saturations were monitored continuously. The                            Olympus Scope SN: [id number] was introduced through                            the anus and advanced to the the cecum, identified                            by appendiceal orifice and ileocecal valve. The                            colonoscopy was performed without difficulty. The                            patient tolerated the procedure well. The quality                            of the bowel preparation was good. The ileocecal                            valve, appendiceal orifice, and rectum were                            photographed. Scope In: 8:40:01 AM Scope Out: 9:08:03 AM Scope Withdrawal Time: 0 hours 18 minutes 43 seconds  Total Procedure Duration: 0 hours 28 minutes 2 seconds  Findings:                 The perianal and digital rectal examinations were                            normal.  A 4 mm polyp was found in the appendiceal orifice.                            The polyp was sessile. The polyp was removed with a                            cold snare. Resection and retrieval were complete.                            Estimated blood loss was minimal.                           Two sessile polyps were found in the descending                            colon. The polyps were 2 to 4 mm in size. These                            polyps were removed with a cold snare. Resection                            and retrieval were complete. Estimated blood loss                            was minimal.                           Four sessile polyps were found in the rectum. The                            polyps were 1 to 3 mm in size. These polyps were                            removed with a cold snare. Resection and retrieval                            were complete.  Estimated blood loss was minimal.                           Anal papilla(e) were hypertrophied.                           The ascending colon revealed significantly                            excessive looping. Advancing the scope required                            using manual pressure and straightening and                            shortening the scope to obtain bowel loop reduction. Complications:  No immediate complications. Estimated Blood Loss:     Estimated blood loss was minimal. Impression:               - One 4 mm polyp at the appendiceal orifice,                            removed with a cold snare. Resected and retrieved.                           - Two 2 to 4 mm polyps in the descending colon,                            removed with a cold snare. Resected and retrieved.                           - Four 1 to 3 mm polyps in the rectum, removed with                            a cold snare. Resected and retrieved.                           - Anal papilla(e) were hypertrophied.                           - There was significant looping of the colon. Recommendation:           - Patient has a contact number available for                            emergencies. The signs and symptoms of potential                            delayed complications were discussed with the                            patient. Return to normal activities tomorrow.                            Written discharge instructions were provided to the                            patient.                           - Resume previous diet.                           - Continue present medications.                           - Await pathology results.                           - Repeat colonoscopy for surveillance based on  pathology results.                           - Return to GI office PRN. Sandor Flatter, MD 10/13/2024 9:13:41 AM

## 2024-10-13 NOTE — Progress Notes (Signed)
 "   GASTROENTEROLOGY PROCEDURE H&P NOTE   Primary Care Physician: Katrinka Garnette KIDD, MD    Reason for Procedure:  Colon polyp surveillance  Plan:    Colonoscopy  Patient is appropriate for endoscopic procedure(s) in the ambulatory ([facility]) setting.  The nature of the procedure, as well as the risks, benefits, and alternatives were carefully and thoroughly reviewed with the patient. Ample time for discussion and questions allowed. The patient understood, was satisfied, and agreed to proceed. I personally addressed all patient questions and concerns.     HPI: Toni Mcgrath is a 74 y.o. female who presents for colonoscopy for ongoing colon polyp surveillance and colon cancer screening.  No active GI symptoms.  No known family history of colon cancer or related malignancy.  Patient is otherwise without complaints or active issues today.  Last colonoscopy was 08/2020 and notable for 5 small 2-5 millimeter polyps (adenoma x 4, hyperplastic polyp x 1) with recommendation to repeat in 3 years for ongoing polyp surveillance.  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 Authorizing Provider  Ascorbic Acid (VITAMIN C PO) Take 1 tablet by mouth daily.   Yes [provider]  aspirin 81 MG tablet Take 81 mg by mouth daily.     Yes [provider]  atorvastatin  (LIPITOR) 20 MG tablet TAKE 1 TABLET(20 MG) BY MOUTH 1 TIME A WEEK 11/24/23  Yes Katrinka Garnette KIDD, MD  B  Complex-Biotin-FA (SUPER B-100) TABS Take 1 tablet by mouth daily.     Yes [provider]  Calcium  Carbonate-Vitamin D (CALCIUM -VITAMIN D) 600-125 MG-UNIT TABS Take 1 tablet by mouth daily.    Yes [provider]  Cholecalciferol (VITAMIN D3) 1000 UNITS tablet Take 1,000 Units by mouth daily.     Yes [provider]  Flaxseed, Linseed, (FLAX SEED OIL) 1000 MG CAPS Take 1,400 mg by mouth daily.    Yes [provider]  hydrochlorothiazide  (HYDRODIURIL ) 25 MG tablet TAKE 1/2 TABLET BY MOUTH DAILY 02/14/24  Yes Katrinka Garnette KIDD, MD  Multiple Vitamin (MULTIVITAMIN) capsule Take 1 capsule by mouth daily.     Yes [provider]  Omega-3 Fatty Acids (FISH OIL) 500 MG CAPS Take 1,200 mg by mouth daily.    Yes [provider]  ZINC OXIDE PO Take 1 tablet by mouth daily.   Yes [provider]  diclofenac  sodium (VOLTAREN ) 1 % GEL Apply topically to affected area qid 06/01/18   Rigby, Michael D, DO  triamcinolone  cream (KENALOG ) 0.5 % Apply 1 application topically 2 (two) times daily as needed. Rash for 7 days 06/25/21   Katrinka Garnette KIDD, MD    Current Outpatient Medications  Medication Sig Dispense Refill   Ascorbic Acid (VITAMIN C PO) Take 1 tablet by mouth daily.     aspirin 81 MG tablet Take 81 mg by mouth  daily.       atorvastatin  (LIPITOR) 20 MG tablet TAKE 1 TABLET(20 MG) BY MOUTH 1 TIME A WEEK 12 tablet 4   B Complex-Biotin-FA (SUPER B-100) TABS Take 1 tablet by mouth daily.       Calcium  Carbonate-Vitamin D (CALCIUM -VITAMIN D) 600-125 MG-UNIT TABS Take 1 tablet by mouth daily.      Cholecalciferol (VITAMIN D3) 1000 UNITS tablet Take 1,000 Units by mouth daily.       Flaxseed, Linseed, (FLAX SEED OIL) 1000 MG CAPS Take 1,400 mg by mouth daily.      hydrochlorothiazide  (HYDRODIURIL ) 25 MG tablet TAKE 1/2 TABLET BY MOUTH DAILY 45 tablet 4   Multiple Vitamin (MULTIVITAMIN) capsule Take 1 capsule by mouth daily.       Omega-3 Fatty Acids  (FISH OIL) 500 MG CAPS Take 1,200 mg by mouth daily.      ZINC OXIDE PO Take 1 tablet by mouth daily.     diclofenac  sodium (VOLTAREN ) 1 % GEL Apply topically to affected area qid 100 g 1   triamcinolone  cream (KENALOG ) 0.5 % Apply 1 application topically 2 (two) times daily as needed. Rash for 7 days 30 g 1   Current Facility-Administered Medications  Medication Dose Route Frequency Provider Last Rate Last Admin   0.9 %  sodium chloride  infusion  500 mL Intravenous Continuous Harmon Bommarito V, DO        Allergies as of 10/13/2024   (No Known Allergies)    Family History  Problem Relation Age of Onset   Stroke Mother 31       stroke   Heart disease Father 68       sudden death while sleeping, no autopsy. prior smoker   Colon polyps Neg Hx    Colon cancer Neg Hx    Esophageal cancer Neg Hx    Rectal cancer Neg Hx    Stomach cancer Neg Hx     Social History   Socioeconomic History   Marital status: Married    Spouse name: Not on file   Number of children: Not on file   Years of education: Not on file   Highest education level: Not on file  Occupational History   Not on file  Tobacco Use   Smoking status: Never   Smokeless tobacco: Never  Vaping Use   Vaping status: Never Used  Substance and Sexual Activity   Alcohol use: Yes    Alcohol/week: 7.0 standard drinks of alcohol    Types: 7 Standard drinks or equivalent per week   Drug use: No   Sexual activity: Not on file  Other Topics Concern   Not on file  Social History Narrative   Married (husband patient Garrel of Dr. Katrinka), no children, no pets.       Works as an pensions consultant- scientist, physiological      Hobbies: walking, working in yard   Social Drivers of Health   Tobacco Use: Low Risk (10/13/2024)   Patient History    Smoking Tobacco Use: Never    Smokeless Tobacco Use: Never    Passive Exposure: Not on file  Financial Resource Strain: Not on file  Food Insecurity: Not on file  Transportation Needs: Not on  file  Physical Activity: Not on file  Stress: Not on file  Social Connections: Not on file  Intimate Partner Violence: Not on file  Depression (PHQ2-9): Low Risk (09/13/2024)   Depression (PHQ2-9)    PHQ-2 Score: 2  Alcohol Screen: Not on  file  Housing: Not on file  Utilities: Not on file  Health Literacy: Not on file    Physical Exam: Vital signs in last 24 hours: @BP  (!) 142/80   Pulse 63   Temp (!) 97.2 F (36.2 C)   Resp 12   Ht 5' 4 (1.626 m)   Wt 164 lb (74.4 kg)   SpO2 97%   BMI 28.15 kg/m  GEN: NAD EYE: Sclerae anicteric ENT: MMM CV: Non-tachycardic Pulm: CTA b/l GI: Soft, NT/ND NEURO:  Alert & Oriented x 3   Sandor Flatter, DO [facility]   10/13/2024 8:33 AM  "

## 2024-10-13 NOTE — Progress Notes (Signed)
 Called to room to assist during endoscopic procedure.  Patient ID and intended procedure confirmed with present staff. Received instructions for my participation in the procedure from the performing physician.

## 2024-10-13 NOTE — Progress Notes (Signed)
 Report to PACU, RN, vss, BBS= Clear.

## 2024-10-13 NOTE — Patient Instructions (Signed)
 Handout provided about polyps.  Resume previous diet.  Continue present medications.  Await pathology results.Repeat colonoscopy for surveillance based on pathology results.  Return to GI office PRN.  YOU HAD AN ENDOSCOPIC PROCEDURE TODAY AT THE [facility]:   Refer to the procedure report that was given to you for any specific questions about what was found during the examination.  If the procedure report does not answer your questions, please call your gastroenterologist to clarify.  If you requested that your care partner not be given the details of your procedure findings, then the procedure report has been included in a sealed envelope for you to review at your convenience later.  YOU SHOULD EXPECT: Some feelings of bloating in the abdomen. Passage of more gas than usual.  Walking can help get rid of the air that was put into your GI tract during the procedure and reduce the bloating. If you had a lower endoscopy (such as a colonoscopy or flexible sigmoidoscopy) you may notice spotting of blood in your stool or on the toilet paper. If you underwent a bowel prep for your procedure, you may not have a normal bowel movement for a few days.  Please Note:  You might notice some irritation and congestion in your nose or some drainage.  This is from the oxygen used during your procedure.  There is no need for concern and it should clear up in a day or so.  SYMPTOMS TO REPORT IMMEDIATELY:  Following lower endoscopy (colonoscopy or flexible sigmoidoscopy):  Excessive amounts of blood in the stool  Significant tenderness or worsening of abdominal pains  Swelling of the abdomen that is new, acute  Fever of 100F or higher  For urgent or emergent issues, a gastroenterologist can be reached at any hour by calling [phone]. Do not use MyChart messaging for urgent concerns.    DIET:  We do recommend a small meal at first, but then you may proceed to your regular diet.  Drink  plenty of fluids but you should avoid alcoholic beverages for 24 hours.  ACTIVITY:  You should plan to take it easy for the rest of today and you should NOT DRIVE or use heavy machinery until tomorrow (because of the sedation medicines used during the test).    FOLLOW UP: Our staff will call the number listed on your records the next business day following your procedure.  We will call around 7:15- 8:00 am to check on you and address any questions or concerns that you may have regarding the information given to you following your procedure. If we do not reach you, we will leave a message.     If any biopsies were taken you will be contacted by phone or by letter within the next 1-3 weeks.  Please call us  at [phone] if you have not heard about the biopsies in 3 weeks.    SIGNATURES/CONFIDENTIALITY: You and/or your care partner have signed paperwork which will be entered into your electronic medical record.  These signatures attest to the fact that that the information above on your After Visit Summary has been reviewed and is understood.  Full responsibility of the confidentiality of this discharge information lies with you and/or your care-partner.

## 2025-10-03 ENCOUNTER — Encounter: Admitting: Family Medicine

## 2025-10-10 ENCOUNTER — Encounter: Admitting: Family Medicine
